# Patient Record
Sex: Female | Born: 1945 | Race: White | Hispanic: No | Marital: Married | Smoking: Never smoker
Health system: Southern US, Community
[De-identification: ages and names within clinical notes are randomized; demographics above are authoritative.]

## PROBLEM LIST (undated history)

## (undated) ENCOUNTER — Emergency Department (HOSPITAL_COMMUNITY): Discharge status: Against Medical Advice | Payer: Self-pay

## (undated) DIAGNOSIS — E7800 Pure hypercholesterolemia, unspecified: Secondary | ICD-10-CM

## (undated) DIAGNOSIS — R519 Headache, unspecified: Secondary | ICD-10-CM

## (undated) DIAGNOSIS — I10 Essential (primary) hypertension: Secondary | ICD-10-CM

## (undated) DIAGNOSIS — R51 Headache: Secondary | ICD-10-CM

## (undated) HISTORY — PX: WISDOM TOOTH EXTRACTION: SHX21

---

## 2012-02-23 ENCOUNTER — Emergency Department (INDEPENDENT_AMBULATORY_CARE_PROVIDER_SITE_OTHER)
Admission: EM | Admit: 2012-02-23 | Discharge: 2012-02-23 | Disposition: A | Discharge status: Home / Self Care | Payer: Self-pay | Source: Home / Self Care

## 2012-02-23 ENCOUNTER — Encounter (HOSPITAL_COMMUNITY): Payer: Self-pay | Admitting: *Deleted

## 2012-02-23 DIAGNOSIS — M542 Cervicalgia: Secondary | ICD-10-CM

## 2012-02-23 HISTORY — DX: Essential (primary) hypertension: I10

## 2012-02-23 HISTORY — DX: Pure hypercholesterolemia, unspecified: E78.00

## 2012-02-23 MED ORDER — CYCLOBENZAPRINE HCL 10 MG PO TABS: 10.0000 mg | ORAL_TABLET | Freq: Three times a day (TID) | ORAL | Status: AC | PRN

## 2012-02-23 MED ORDER — MELOXICAM 15 MG PO TABS: 15.0000 mg | ORAL_TABLET | Freq: Every day | ORAL | Status: DC

## 2012-02-23 MED ORDER — TRAMADOL HCL 50 MG PO TABS: 50.0000 mg | ORAL_TABLET | Freq: Four times a day (QID) | ORAL | Status: AC | PRN

## 2012-02-23 NOTE — ED Notes (Signed)
Pt c/o neck and shoulder pain onset about a week ago.  No known injury.  States that it's a throbbing, constant pain.

## 2012-02-23 NOTE — ED Provider Notes (Signed)
Medical screening examination/treatment/procedure(s) were performed by PGY-3 [facility] resident and as supervising physician I was immediately available for consultation/collaboration.   Aury Scollard Moreno-Coll, MD   Tatem Fesler Moreno-Coll, MD 02/23/12 1941 

## 2012-02-23 NOTE — Discharge Instructions (Signed)
Thank you for coming in today. Your pain is coming from a injured muscle in your neck.  Use the meloxicam daily and Flexeril as needed.  Tramadol should be used for breakthrough pain.  Continue your normal activities. Go to the emergency room if you have weakness in your hands problems walking or problems pooping or urinating.   Cervical Strain and Sprain (Whiplash) with Rehab Cervical strain and sprains are injuries that commonly occur with "whiplash" injuries. Whiplash occurs when the neck is forcefully whipped backward or forward, such as during a motor vehicle accident. The muscles, ligaments, tendons, discs and nerves of the neck are susceptible to injury when this occurs. SYMPTOMS   Pain or stiffness in the front and/or back of neck   Symptoms may present immediately or up to 24 hours after injury.   Dizziness, headache, nausea and vomiting.   Muscle spasm with soreness and stiffness in the neck.   Tenderness and swelling at the injury site.  CAUSES  Whiplash injuries often occur during contact sports or motor vehicle accidents.  RISK INCREASES WITH:  Osteoarthritis of the spine.   Situations that make head or neck accidents or trauma more likely.   High-risk sports (football, rugby, wrestling, hockey, auto racing, gymnastics, diving, contact karate or boxing).   Poor strength and flexibility of the neck.   Previous neck injury.   Poor tackling technique.   Improperly fitted or padded equipment.  PREVENTION  Learn and use proper technique (avoid tackling with the head, spearing and head-butting; use proper falling techniques to avoid landing on the head).   Warm up and stretch properly before activity.   Maintain physical fitness:   Strength, flexibility and endurance.   Cardiovascular fitness.   Wear properly fitted and padded protective equipment, such as padded soft collars, for participation in contact sports.  PROGNOSIS  Recovery for cervical strain and  sprain injuries is dependent on the extent of the injury. These injuries are usually curable in 1 week to 3 months with appropriate treatment.  RELATED COMPLICATIONS   Temporary numbness and weakness may occur if the nerve roots are damaged, and this may persist until the nerve has completely healed.   Chronic pain due to frequent recurrence of symptoms.   Prolonged healing, especially if activity is resumed too soon (before complete recovery).  TREATMENT  Treatment initially involves the use of ice and medication to help reduce pain and inflammation. It is also important to perform strengthening and stretching exercises and modify activities that worsen symptoms so the injury does not get worse. These exercises may be performed at home or with a therapist. For patients who experience severe symptoms, a soft padded collar may be recommended to be worn around the neck.  Improving your posture may help reduce symptoms. Posture improvement includes pulling your chin and abdomen in while sitting or standing. If you are sitting, sit in a firm chair with your buttocks against the back of the chair. While sleeping, try replacing your pillow with a small towel rolled to 2 inches in diameter, or use a cervical pillow or soft cervical collar. Poor sleeping positions delay healing.  For patients with nerve root damage, which causes numbness or weakness, the use of a cervical traction apparatus may be recommended. Surgery is rarely necessary for these injuries. However, cervical strain and sprains that are present at birth (congenital) may require surgery. MEDICATION   If pain medication is necessary, nonsteroidal anti-inflammatory medications, such as aspirin and ibuprofen, or other minor  pain relievers, such as acetaminophen, are often recommended.   Do not take pain medication for 7 days before surgery.   Prescription pain relievers may be given if deemed necessary by your caregiver. Use only as directed  and only as much as you need.  HEAT AND COLD:   Cold treatment (icing) relieves pain and reduces inflammation. Cold treatment should be applied for 10 to 15 minutes every 2 to 3 hours for inflammation and pain and immediately after any activity that aggravates your symptoms. Use ice packs or an ice massage.   Heat treatment may be used prior to performing the stretching and strengthening activities prescribed by your caregiver, physical therapist, or athletic trainer. Use a heat pack or a warm soak.  SEEK MEDICAL CARE IF:   Symptoms get worse or do not improve in 2 weeks despite treatment.   New, unexplained symptoms develop (drugs used in treatment may produce side effects).  EXERCISES RANGE OF MOTION (ROM) AND STRETCHING EXERCISES - Cervical Strain and Sprain These exercises may help you when beginning to rehabilitate your injury. In order to successfully resolve your symptoms, you must improve your posture. These exercises are designed to help reduce the forward-head and rounded-shoulder posture which contributes to this condition. Your symptoms may resolve with or without further involvement from your physician, physical therapist or athletic trainer. While completing these exercises, remember:   Restoring tissue flexibility helps normal motion to return to the joints. This allows healthier, less painful movement and activity.   An effective stretch should be held for at least 20 seconds, although you may need to begin with shorter hold times for comfort.   A stretch should never be painful. You should only feel a gentle lengthening or release in the stretched tissue.  STRETCH- Axial Extensors  Lie on your back on the floor. You may bend your knees for comfort. Place a rolled up hand towel or dish towel, about 2 inches in diameter, under the part of your head that makes contact with the floor.   Gently tuck your chin, as if trying to make a "double chin," until you feel a gentle stretch  at the base of your head.   Hold __________ seconds.  Repeat __________ times. Complete this exercise __________ times per day.  STRETECH - Axial Extension   Stand or sit on a firm surface. Assume a good posture: chest up, shoulders drawn back, abdominal muscles slightly tense, knees unlocked (if standing) and feet hip width apart.   Slowly retract your chin so your head slides back and your chin slightly lowers.Continue to look straight ahead.   You should feel a gentle stretch in the back of your head. Be certain not to feel an aggressive stretch since this can cause headaches later.   Hold for __________ seconds.  Repeat __________ times. Complete this exercise __________ times per day. STRETCH - Cervical Side Bend   Stand or sit on a firm surface. Assume a good posture: chest up, shoulders drawn back, abdominal muscles slightly tense, knees unlocked (if standing) and feet hip width apart.   Without letting your nose or shoulders move, slowly tip your right / left ear to your shoulder until your feel a gentle stretch in the muscles on the opposite side of your neck.   Hold __________ seconds.  Repeat __________ times. Complete this exercise __________ times per day. STRETCH - Cervical Rotators   Stand or sit on a firm surface. Assume a good posture: chest up, shoulders drawn  back, abdominal muscles slightly tense, knees unlocked (if standing) and feet hip width apart.   Keeping your eyes level with the ground, slowly turn your head until you feel a gentle stretch along the back and opposite side of your neck.   Hold __________ seconds.  Repeat __________ times. Complete this exercise __________ times per day. RANGE OF MOTION - Neck Circles   Stand or sit on a firm surface. Assume a good posture: chest up, shoulders drawn back, abdominal muscles slightly tense, knees unlocked (if standing) and feet hip width apart.   Gently roll your head down and around from the back of one  shoulder to the back of the other. The motion should never be forced or painful.   Repeat the motion 10-20 times, or until you feel the neck muscles relax and loosen.  Repeat __________ times. Complete the exercise __________ times per day. STRENGTHENING EXERCISES - Cervical Strain and Sprain These exercises may help you when beginning to rehabilitate your injury. They may resolve your symptoms with or without further involvement from your physician, physical therapist or athletic trainer. While completing these exercises, remember:   Muscles can gain both the endurance and the strength needed for everyday activities through controlled exercises.   Complete these exercises as instructed by your physician, physical therapist or athletic trainer. Progress the resistance and repetitions only as guided.   You may experience muscle soreness or fatigue, but the pain or discomfort you are trying to eliminate should never worsen during these exercises. If this pain does worsen, stop and make certain you are following the directions exactly. If the pain is still present after adjustments, discontinue the exercise until you can discuss the trouble with your clinician.  STRENGTH - Cervical Flexors, Isometric  Face a wall, standing about 6 inches away. Place a small pillow, a ball about 6-8 inches in diameter, or a folded towel between your forehead and the wall.   Slightly tuck your chin and gently push your forehead into the soft object. Push only with mild to moderate intensity, building up tension gradually. Keep your jaw and forehead relaxed.   Hold 10 to 20 seconds. Keep your breathing relaxed.   Release the tension slowly. Relax your neck muscles completely before you start the next repetition.  Repeat __________ times. Complete this exercise __________ times per day. STRENGTH- Cervical Lateral Flexors, Isometric   Stand about 6 inches away from a wall. Place a small pillow, a ball about 6-8  inches in diameter, or a folded towel between the side of your head and the wall.   Slightly tuck your chin and gently tilt your head into the soft object. Push only with mild to moderate intensity, building up tension gradually. Keep your jaw and forehead relaxed.   Hold 10 to 20 seconds. Keep your breathing relaxed.   Release the tension slowly. Relax your neck muscles completely before you start the next repetition.  Repeat __________ times. Complete this exercise __________ times per day. STRENGTH - Cervical Extensors, Isometric   Stand about 6 inches away from a wall. Place a small pillow, a ball about 6-8 inches in diameter, or a folded towel between the back of your head and the wall.   Slightly tuck your chin and gently tilt your head back into the soft object. Push only with mild to moderate intensity, building up tension gradually. Keep your jaw and forehead relaxed.   Hold 10 to 20 seconds. Keep your breathing relaxed.   Release the  tension slowly. Relax your neck muscles completely before you start the next repetition.  Repeat __________ times. Complete this exercise __________ times per day. POSTURE AND BODY MECHANICS CONSIDERATIONS - Cervical Strain and Sprain Keeping correct posture when sitting, standing or completing your activities will reduce the stress put on different body tissues, allowing injured tissues a chance to heal and limiting painful experiences. The following are general guidelines for improved posture. Your physician or physical therapist will provide you with any instructions specific to your needs. While reading these guidelines, remember:  The exercises prescribed by your provider will help you have the flexibility and strength to maintain correct postures.   The correct posture provides the optimal environment for your joints to work. All of your joints have less wear and tear when properly supported by a spine with good posture. This means you will  experience a healthier, less painful body.   Correct posture must be practiced with all of your activities, especially prolonged sitting and standing. Correct posture is as important when doing repetitive low-stress activities (typing) as it is when doing a single heavy-load activity (lifting).  PROLONGED STANDING WHILE SLIGHTLY LEANING FORWARD When completing a task that requires you to lean forward while standing in one place for a long time, place either foot up on a stationary 2-4 inch high object to help maintain the best posture. When both feet are on the ground, the low back tends to lose its slight inward curve. If this curve flattens (or becomes too large), then the back and your other joints will experience too much stress, fatigue more quickly and can cause pain.  RESTING POSITIONS Consider which positions are most painful for you when choosing a resting position. If you have pain with flexion-based activities (sitting, bending, stooping, squatting), choose a position that allows you to rest in a less flexed posture. You would want to avoid curling into a fetal position on your side. If your pain worsens with extension-based activities (prolonged standing, working overhead), avoid resting in an extended position such as sleeping on your stomach. Most people will find more comfort when they rest with their spine in a more neutral position, neither too rounded nor too arched. Lying on a non-sagging bed on your side with a pillow between your knees, or on your back with a pillow under your knees will often provide some relief. Keep in mind, being in any one position for a prolonged period of time, no matter how correct your posture, can still lead to stiffness. WALKING Walk with an upright posture. Your ears, shoulders and hips should all line-up. OFFICE WORK When working at a desk, create an environment that supports good, upright posture. Without extra support, muscles fatigue and lead to  excessive strain on joints and other tissues. CHAIR:  A chair should be able to slide under your desk when your back makes contact with the back of the chair. This allows you to work closely.   The chair's height should allow your eyes to be level with the upper part of your monitor and your hands to be slightly lower than your elbows.   Body position:   Your feet should make contact with the floor. If this is not possible, use a foot rest.   Keep your ears over your shoulders. This will reduce stress on your neck and low back.  Document Released: 11/11/2005 Document Revised: 10/31/2011 Document Reviewed: 02/23/2009 [facility] Patient Information 2012 [place], [place].

## 2012-02-23 NOTE — ED Provider Notes (Signed)
Ariel Walker is a 66 y.o. female who presents to Urgent Care today for left-sided neck pain radiating to the shoulder for one week. Has been taking Tylenol, Advil, and Aleve which helps not very much. Additionally she history heating pads. She denies any radicular pain to her hand numbness tingling or weakness. Additionally she denies any difficulty walking or bowel or bladder dysfunction. Her only significant past medical history his tennis elbow. She does not have her primary care doctor nor insurance. No history of injury before the pain started.   PMH reviewed. Otherwise healthy ROS as above otherwise neg.  no chest pains, palpitations, fevers, chills, abdominal pain nausea or vomiting. Medications reviewed. No current facility-administered medications for this encounter.   Current Outpatient Prescriptions  Medication Sig Dispense Refill  . cyclobenzaprine (FLEXERIL) 10 MG tablet Take 1 tablet (10 mg total) by mouth 3 (three) times daily as needed for muscle spasms.  30 tablet  0  . meloxicam (MOBIC) 15 MG tablet Take 1 tablet (15 mg total) by mouth daily.  14 tablet  0  . traMADol (ULTRAM) 50 MG tablet Take 1 tablet (50 mg total) by mouth every 6 (six) hours as needed for pain.  30 tablet  0    Exam:  BP 131/90  Pulse 93  Temp(Src) 97.9 F (36.6 C) (Oral)  Resp 18  SpO2 97% Gen: Well NAD Lungs: CTABL Nl WOB Heart: RRR no readings of the neck soft systolic murmur at the right upper sternal border Abd: NABS, NT, ND Exts: Non edematous BL  LE, warm and well perfused.  MSK: Neck: Inspection unremarkable. No palpable stepoffs. Or midline pain. Mildly tender on the left paraspinal muscles. Negative Spurling's maneuver. Full neck range of motion Grip strength and sensation normal in bilateral hands Strength good C4 to T1 distribution No sensory change to C4 to T1 Reflexes normal  Shoulder range of motion is normal negative impingement tests. Gait is normal. Patient is able to get  onto and off exam table by herself.    Assessment and Plan: 66 y.o. female with muscular neck pain.  No midline tenderness and exam is relatively normal.  She has no history of injury. Plan for symptom management with meloxicam, Flexeril, and tramadol as needed for breakthrough pain. Provided rehabilitation instructions and signs or symptoms that would promote return to health care. Patient expresses understanding. Followup as needed.  Her systolic murmur is likely aortic stenosis.  This is asymptomatic at this time. I recommend that she followup with her primary care doctor at some point this year. She will turn 22 and become Medicare eligible in August.      Rodolph Bong, MD 02/23/12 1357

## 2012-03-06 ENCOUNTER — Telehealth (HOSPITAL_COMMUNITY): Payer: Self-pay | Admitting: *Deleted

## 2012-03-06 NOTE — ED Notes (Signed)
Pt. called on VM @ 1358 and said she is having a problem with rectal bleeding.  1525  I called pt. back and she said she was seen here on Easter for a pulled muscle and got Mobic, Tramadol and Cyclobenzaprine. She took medicine for 1 week and got constipated. She has had 2 hard BM's and her hemorrhoids acted up. She is using Preparation H and wipes but states bleeding has not stopped. Passing bright red blood on tissue and in toilet approx. 1 tsp. I asked if is it still bleeding and she said yes.  I told her to apply pressure with an unsterile 4x4 for 10 min. continuously.  If the bleeding does not stop to go to the ED because she may need to have it cauterized. Pt. does not have a PCP. Pt. voiced understanding. Vassie Moselle 03/06/2012

## 2012-11-11 ENCOUNTER — Encounter (HOSPITAL_COMMUNITY): Payer: Self-pay | Admitting: Emergency Medicine

## 2012-11-11 ENCOUNTER — Emergency Department (HOSPITAL_COMMUNITY)
Admission: EM | Admit: 2012-11-11 | Discharge: 2012-11-11 | Disposition: A | Discharge status: Home / Self Care | Payer: No Typology Code available for payment source | Attending: Emergency Medicine | Admitting: Emergency Medicine

## 2012-11-11 ENCOUNTER — Emergency Department (HOSPITAL_COMMUNITY): Payer: No Typology Code available for payment source

## 2012-11-11 DIAGNOSIS — M25562 Pain in left knee: Secondary | ICD-10-CM

## 2012-11-11 DIAGNOSIS — S300XXA Contusion of lower back and pelvis, initial encounter: Secondary | ICD-10-CM

## 2012-11-11 DIAGNOSIS — Y939 Activity, unspecified: Secondary | ICD-10-CM | POA: Insufficient documentation

## 2012-11-11 DIAGNOSIS — E7800 Pure hypercholesterolemia, unspecified: Secondary | ICD-10-CM | POA: Insufficient documentation

## 2012-11-11 DIAGNOSIS — I10 Essential (primary) hypertension: Secondary | ICD-10-CM | POA: Insufficient documentation

## 2012-11-11 DIAGNOSIS — M25569 Pain in unspecified knee: Secondary | ICD-10-CM | POA: Insufficient documentation

## 2012-11-11 DIAGNOSIS — Y92410 Unspecified street and highway as the place of occurrence of the external cause: Secondary | ICD-10-CM | POA: Insufficient documentation

## 2012-11-11 DIAGNOSIS — S20229A Contusion of unspecified back wall of thorax, initial encounter: Secondary | ICD-10-CM | POA: Insufficient documentation
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
## 2012-11-11 NOTE — ED Notes (Signed)
Pt denied any pain while being cleared from spinal board. Amber, RN assisted in procedure.
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
## 2013-08-23 ENCOUNTER — Other Ambulatory Visit (HOSPITAL_COMMUNITY): Payer: Self-pay | Admitting: Family Medicine

## 2013-08-23 DIAGNOSIS — R922 Inconclusive mammogram: Secondary | ICD-10-CM

## 2013-08-25 ENCOUNTER — Encounter (HOSPITAL_COMMUNITY): Payer: Medicare Other

## 2013-09-01 ENCOUNTER — Ambulatory Visit (HOSPITAL_COMMUNITY)
Admission: RE | Admit: 2013-09-01 | Discharge: 2013-09-01 | Disposition: A | Discharge status: Home / Self Care | Payer: Medicare Other | Source: Ambulatory Visit | Attending: Family Medicine | Admitting: Family Medicine

## 2013-09-01 DIAGNOSIS — R928 Other abnormal and inconclusive findings on diagnostic imaging of breast: Secondary | ICD-10-CM | POA: Insufficient documentation

## 2013-09-01 DIAGNOSIS — R922 Inconclusive mammogram: Secondary | ICD-10-CM

## 2014-02-02 ENCOUNTER — Other Ambulatory Visit (HOSPITAL_COMMUNITY): Payer: Self-pay | Admitting: Family Medicine

## 2014-02-02 DIAGNOSIS — R928 Other abnormal and inconclusive findings on diagnostic imaging of breast: Secondary | ICD-10-CM

## 2014-03-09 ENCOUNTER — Ambulatory Visit (HOSPITAL_COMMUNITY)
Admission: RE | Admit: 2014-03-09 | Discharge: 2014-03-09 | Disposition: A | Discharge status: Home / Self Care | Payer: Medicare Other | Source: Ambulatory Visit | Attending: Family Medicine | Admitting: Family Medicine

## 2014-03-09 DIAGNOSIS — R928 Other abnormal and inconclusive findings on diagnostic imaging of breast: Secondary | ICD-10-CM

## 2014-03-09 DIAGNOSIS — N63 Unspecified lump in unspecified breast: Secondary | ICD-10-CM | POA: Insufficient documentation

## 2014-08-03 ENCOUNTER — Other Ambulatory Visit (HOSPITAL_COMMUNITY): Payer: Self-pay | Admitting: Family Medicine

## 2014-08-03 DIAGNOSIS — N6320 Unspecified lump in the left breast, unspecified quadrant: Principal | ICD-10-CM

## 2014-08-03 DIAGNOSIS — N6310 Unspecified lump in the right breast, unspecified quadrant: Secondary | ICD-10-CM

## 2014-08-03 DIAGNOSIS — R921 Mammographic calcification found on diagnostic imaging of breast: Secondary | ICD-10-CM

## 2014-08-03 DIAGNOSIS — Z09 Encounter for follow-up examination after completed treatment for conditions other than malignant neoplasm: Secondary | ICD-10-CM

## 2014-09-13 ENCOUNTER — Ambulatory Visit (HOSPITAL_COMMUNITY)
Admission: RE | Admit: 2014-09-13 | Discharge: 2014-09-13 | Disposition: A | Discharge status: Home / Self Care | Payer: Medicare Other | Source: Ambulatory Visit | Attending: Family Medicine | Admitting: Family Medicine

## 2014-09-13 DIAGNOSIS — R928 Other abnormal and inconclusive findings on diagnostic imaging of breast: Secondary | ICD-10-CM | POA: Diagnosis not present

## 2014-09-13 DIAGNOSIS — N6310 Unspecified lump in the right breast, unspecified quadrant: Secondary | ICD-10-CM

## 2014-09-13 DIAGNOSIS — N6320 Unspecified lump in the left breast, unspecified quadrant: Secondary | ICD-10-CM

## 2014-09-13 DIAGNOSIS — N63 Unspecified lump in breast: Secondary | ICD-10-CM | POA: Insufficient documentation

## 2014-09-13 DIAGNOSIS — R921 Mammographic calcification found on diagnostic imaging of breast: Secondary | ICD-10-CM

## 2014-09-13 DIAGNOSIS — Z09 Encounter for follow-up examination after completed treatment for conditions other than malignant neoplasm: Secondary | ICD-10-CM

## 2015-09-01 ENCOUNTER — Other Ambulatory Visit (HOSPITAL_COMMUNITY): Payer: Self-pay | Admitting: Family Medicine

## 2015-09-01 DIAGNOSIS — R229 Localized swelling, mass and lump, unspecified: Principal | ICD-10-CM

## 2015-09-01 DIAGNOSIS — IMO0002 Reserved for concepts with insufficient information to code with codable children: Secondary | ICD-10-CM

## 2015-09-19 ENCOUNTER — Ambulatory Visit (HOSPITAL_COMMUNITY)
Admission: RE | Admit: 2015-09-19 | Discharge: 2015-09-19 | Disposition: A | Discharge status: Home / Self Care | Payer: PPO | Source: Ambulatory Visit | Attending: Family Medicine | Admitting: Family Medicine

## 2015-09-19 DIAGNOSIS — R229 Localized swelling, mass and lump, unspecified: Secondary | ICD-10-CM

## 2015-09-19 DIAGNOSIS — R921 Mammographic calcification found on diagnostic imaging of breast: Secondary | ICD-10-CM | POA: Insufficient documentation

## 2015-09-19 DIAGNOSIS — IMO0002 Reserved for concepts with insufficient information to code with codable children: Secondary | ICD-10-CM

## 2015-12-07 DIAGNOSIS — H52223 Regular astigmatism, bilateral: Secondary | ICD-10-CM | POA: Diagnosis not present

## 2015-12-07 DIAGNOSIS — H25013 Cortical age-related cataract, bilateral: Secondary | ICD-10-CM | POA: Diagnosis not present

## 2015-12-07 DIAGNOSIS — H5213 Myopia, bilateral: Secondary | ICD-10-CM | POA: Diagnosis not present

## 2015-12-07 DIAGNOSIS — H524 Presbyopia: Secondary | ICD-10-CM | POA: Diagnosis not present

## 2015-12-07 DIAGNOSIS — H1851 Endothelial corneal dystrophy: Secondary | ICD-10-CM | POA: Diagnosis not present

## 2015-12-07 DIAGNOSIS — H3540 Unspecified peripheral retinal degeneration: Secondary | ICD-10-CM | POA: Diagnosis not present

## 2016-02-07 DIAGNOSIS — I10 Essential (primary) hypertension: Secondary | ICD-10-CM | POA: Diagnosis not present

## 2016-02-07 DIAGNOSIS — E782 Mixed hyperlipidemia: Secondary | ICD-10-CM | POA: Diagnosis not present

## 2016-02-07 DIAGNOSIS — E559 Vitamin D deficiency, unspecified: Secondary | ICD-10-CM | POA: Diagnosis not present

## 2016-02-07 DIAGNOSIS — R030 Elevated blood-pressure reading, without diagnosis of hypertension: Secondary | ICD-10-CM | POA: Diagnosis not present

## 2016-02-07 DIAGNOSIS — H1851 Endothelial corneal dystrophy: Secondary | ICD-10-CM | POA: Diagnosis not present

## 2016-02-07 DIAGNOSIS — H2513 Age-related nuclear cataract, bilateral: Secondary | ICD-10-CM | POA: Diagnosis not present

## 2016-02-13 DIAGNOSIS — E782 Mixed hyperlipidemia: Secondary | ICD-10-CM | POA: Diagnosis not present

## 2016-02-13 DIAGNOSIS — E559 Vitamin D deficiency, unspecified: Secondary | ICD-10-CM | POA: Diagnosis not present

## 2016-02-13 DIAGNOSIS — H1851 Endothelial corneal dystrophy: Secondary | ICD-10-CM | POA: Diagnosis not present

## 2016-02-13 DIAGNOSIS — Z1389 Encounter for screening for other disorder: Secondary | ICD-10-CM | POA: Diagnosis not present

## 2016-02-13 DIAGNOSIS — Z0001 Encounter for general adult medical examination with abnormal findings: Secondary | ICD-10-CM | POA: Diagnosis not present

## 2016-02-13 DIAGNOSIS — I10 Essential (primary) hypertension: Secondary | ICD-10-CM | POA: Diagnosis not present

## 2016-03-05 DIAGNOSIS — M5431 Sciatica, right side: Secondary | ICD-10-CM | POA: Diagnosis not present

## 2016-03-13 DIAGNOSIS — H1851 Endothelial corneal dystrophy: Secondary | ICD-10-CM | POA: Diagnosis not present

## 2016-03-13 DIAGNOSIS — H2513 Age-related nuclear cataract, bilateral: Secondary | ICD-10-CM | POA: Diagnosis not present

## 2016-05-06 DIAGNOSIS — H52203 Unspecified astigmatism, bilateral: Secondary | ICD-10-CM | POA: Diagnosis not present

## 2016-05-06 DIAGNOSIS — H5213 Myopia, bilateral: Secondary | ICD-10-CM | POA: Diagnosis not present

## 2016-05-16 DIAGNOSIS — M1712 Unilateral primary osteoarthritis, left knee: Secondary | ICD-10-CM | POA: Diagnosis not present

## 2016-05-16 DIAGNOSIS — M1711 Unilateral primary osteoarthritis, right knee: Secondary | ICD-10-CM | POA: Diagnosis not present

## 2016-05-16 DIAGNOSIS — M7122 Synovial cyst of popliteal space [Baker], left knee: Secondary | ICD-10-CM | POA: Diagnosis not present

## 2016-05-16 DIAGNOSIS — M7121 Synovial cyst of popliteal space [Baker], right knee: Secondary | ICD-10-CM | POA: Diagnosis not present

## 2016-05-20 DIAGNOSIS — M6281 Muscle weakness (generalized): Secondary | ICD-10-CM | POA: Diagnosis not present

## 2016-05-20 DIAGNOSIS — M170 Bilateral primary osteoarthritis of knee: Secondary | ICD-10-CM | POA: Diagnosis not present

## 2016-05-20 DIAGNOSIS — M7121 Synovial cyst of popliteal space [Baker], right knee: Secondary | ICD-10-CM | POA: Diagnosis not present

## 2016-05-20 DIAGNOSIS — M7122 Synovial cyst of popliteal space [Baker], left knee: Secondary | ICD-10-CM | POA: Diagnosis not present

## 2016-05-29 DIAGNOSIS — M6281 Muscle weakness (generalized): Secondary | ICD-10-CM | POA: Diagnosis not present

## 2016-05-29 DIAGNOSIS — M7120 Synovial cyst of popliteal space [Baker], unspecified knee: Secondary | ICD-10-CM | POA: Diagnosis not present

## 2016-05-29 DIAGNOSIS — M170 Bilateral primary osteoarthritis of knee: Secondary | ICD-10-CM | POA: Diagnosis not present

## 2016-08-12 DIAGNOSIS — E782 Mixed hyperlipidemia: Secondary | ICD-10-CM | POA: Diagnosis not present

## 2016-08-12 DIAGNOSIS — I10 Essential (primary) hypertension: Secondary | ICD-10-CM | POA: Diagnosis not present

## 2016-08-15 DIAGNOSIS — M1711 Unilateral primary osteoarthritis, right knee: Secondary | ICD-10-CM | POA: Diagnosis not present

## 2016-08-15 DIAGNOSIS — B079 Viral wart, unspecified: Secondary | ICD-10-CM | POA: Diagnosis not present

## 2016-08-15 DIAGNOSIS — E559 Vitamin D deficiency, unspecified: Secondary | ICD-10-CM | POA: Diagnosis not present

## 2016-08-15 DIAGNOSIS — H1851 Endothelial corneal dystrophy: Secondary | ICD-10-CM | POA: Diagnosis not present

## 2016-08-15 DIAGNOSIS — I10 Essential (primary) hypertension: Secondary | ICD-10-CM | POA: Diagnosis not present

## 2016-08-15 DIAGNOSIS — M1712 Unilateral primary osteoarthritis, left knee: Secondary | ICD-10-CM | POA: Diagnosis not present

## 2016-08-15 DIAGNOSIS — Z6834 Body mass index (BMI) 34.0-34.9, adult: Secondary | ICD-10-CM | POA: Diagnosis not present

## 2016-08-15 DIAGNOSIS — E782 Mixed hyperlipidemia: Secondary | ICD-10-CM | POA: Diagnosis not present

## 2016-08-27 ENCOUNTER — Other Ambulatory Visit (HOSPITAL_COMMUNITY): Payer: Self-pay | Admitting: Family Medicine

## 2016-08-27 DIAGNOSIS — Z1231 Encounter for screening mammogram for malignant neoplasm of breast: Secondary | ICD-10-CM

## 2016-09-19 ENCOUNTER — Ambulatory Visit (HOSPITAL_COMMUNITY)
Admission: RE | Admit: 2016-09-19 | Discharge: 2016-09-19 | Disposition: A | Discharge status: Home / Self Care | Payer: PPO | Source: Ambulatory Visit | Attending: Family Medicine | Admitting: Family Medicine

## 2016-09-19 DIAGNOSIS — Z1231 Encounter for screening mammogram for malignant neoplasm of breast: Secondary | ICD-10-CM | POA: Diagnosis not present

## 2017-02-18 DIAGNOSIS — E782 Mixed hyperlipidemia: Secondary | ICD-10-CM | POA: Diagnosis not present

## 2017-02-18 DIAGNOSIS — I10 Essential (primary) hypertension: Secondary | ICD-10-CM | POA: Diagnosis not present

## 2017-02-18 DIAGNOSIS — E559 Vitamin D deficiency, unspecified: Secondary | ICD-10-CM | POA: Diagnosis not present

## 2017-02-19 DIAGNOSIS — E559 Vitamin D deficiency, unspecified: Secondary | ICD-10-CM | POA: Diagnosis not present

## 2017-02-19 DIAGNOSIS — E782 Mixed hyperlipidemia: Secondary | ICD-10-CM | POA: Diagnosis not present

## 2017-02-19 DIAGNOSIS — M1712 Unilateral primary osteoarthritis, left knee: Secondary | ICD-10-CM | POA: Diagnosis not present

## 2017-02-19 DIAGNOSIS — Z6834 Body mass index (BMI) 34.0-34.9, adult: Secondary | ICD-10-CM | POA: Diagnosis not present

## 2017-02-19 DIAGNOSIS — I10 Essential (primary) hypertension: Secondary | ICD-10-CM | POA: Diagnosis not present

## 2017-02-19 DIAGNOSIS — M1711 Unilateral primary osteoarthritis, right knee: Secondary | ICD-10-CM | POA: Diagnosis not present

## 2017-02-19 DIAGNOSIS — Z0001 Encounter for general adult medical examination with abnormal findings: Secondary | ICD-10-CM | POA: Diagnosis not present

## 2017-02-19 DIAGNOSIS — H1851 Endothelial corneal dystrophy: Secondary | ICD-10-CM | POA: Diagnosis not present

## 2017-02-25 ENCOUNTER — Other Ambulatory Visit (HOSPITAL_COMMUNITY): Payer: Self-pay | Admitting: Family Medicine

## 2017-02-25 DIAGNOSIS — M8580 Other specified disorders of bone density and structure, unspecified site: Secondary | ICD-10-CM

## 2017-02-25 DIAGNOSIS — Z780 Asymptomatic menopausal state: Secondary | ICD-10-CM

## 2017-03-05 ENCOUNTER — Ambulatory Visit (HOSPITAL_COMMUNITY)
Admission: RE | Admit: 2017-03-05 | Discharge: 2017-03-05 | Disposition: A | Discharge status: Home / Self Care | Payer: PPO | Source: Ambulatory Visit | Attending: Family Medicine | Admitting: Family Medicine

## 2017-03-05 DIAGNOSIS — Z780 Asymptomatic menopausal state: Secondary | ICD-10-CM | POA: Diagnosis not present

## 2017-03-05 DIAGNOSIS — M810 Age-related osteoporosis without current pathological fracture: Secondary | ICD-10-CM | POA: Diagnosis not present

## 2017-03-05 DIAGNOSIS — M8580 Other specified disorders of bone density and structure, unspecified site: Secondary | ICD-10-CM

## 2017-07-12 ENCOUNTER — Encounter (HOSPITAL_COMMUNITY): Payer: Self-pay | Admitting: *Deleted

## 2017-07-12 ENCOUNTER — Emergency Department (HOSPITAL_COMMUNITY)
Admission: EM | Admit: 2017-07-12 | Discharge: 2017-07-12 | Disposition: A | Discharge status: Home / Self Care | Payer: PPO | Attending: Emergency Medicine | Admitting: Emergency Medicine

## 2017-07-12 ENCOUNTER — Emergency Department (HOSPITAL_COMMUNITY): Payer: PPO

## 2017-07-12 DIAGNOSIS — Y929 Unspecified place or not applicable: Secondary | ICD-10-CM | POA: Diagnosis not present

## 2017-07-12 DIAGNOSIS — Y939 Activity, unspecified: Secondary | ICD-10-CM | POA: Diagnosis not present

## 2017-07-12 DIAGNOSIS — S82832A Other fracture of upper and lower end of left fibula, initial encounter for closed fracture: Secondary | ICD-10-CM | POA: Diagnosis not present

## 2017-07-12 DIAGNOSIS — Y999 Unspecified external cause status: Secondary | ICD-10-CM | POA: Insufficient documentation

## 2017-07-12 DIAGNOSIS — S99912A Unspecified injury of left ankle, initial encounter: Secondary | ICD-10-CM | POA: Diagnosis present

## 2017-07-12 DIAGNOSIS — W010XXA Fall on same level from slipping, tripping and stumbling without subsequent striking against object, initial encounter: Secondary | ICD-10-CM | POA: Diagnosis not present

## 2017-07-12 DIAGNOSIS — Z23 Encounter for immunization: Secondary | ICD-10-CM | POA: Diagnosis not present

## 2017-07-12 DIAGNOSIS — I10 Essential (primary) hypertension: Secondary | ICD-10-CM | POA: Insufficient documentation

## 2017-07-12 DIAGNOSIS — S92902A Unspecified fracture of left foot, initial encounter for closed fracture: Secondary | ICD-10-CM | POA: Diagnosis not present

## 2017-07-12 MED ORDER — IBUPROFEN 800 MG PO TABS: 800.0000 mg | ORAL_TABLET | Freq: Three times a day (TID) | ORAL | 0 refills | Status: DC

## 2017-07-12 MED ORDER — IBUPROFEN 800 MG PO TABS
800.0000 mg | ORAL_TABLET | Freq: Once | ORAL | Status: AC
  Administered 2017-07-12: 800 mg via ORAL
  Filled 2017-07-12: qty 1

## 2017-07-12 MED ORDER — BACITRACIN ZINC 500 UNIT/GM EX OINT
TOPICAL_OINTMENT | CUTANEOUS | Status: AC
  Filled 2017-07-12: qty 0.9

## 2017-07-12 MED ORDER — TETANUS-DIPHTH-ACELL PERTUSSIS 5-2.5-18.5 LF-MCG/0.5 IM SUSP
0.5000 mL | Freq: Once | INTRAMUSCULAR | Status: AC
  Administered 2017-07-12: 0.5 mL via INTRAMUSCULAR
  Filled 2017-07-12: qty 0.5
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
## 2017-07-12 NOTE — ED Provider Notes (Signed)
[facility] DEPT Provider Note   CSN: [id number] Arrival date & time: 07/12/17  1654     History   Chief Complaint Chief Complaint  Patient presents with  . Ankle Pain    HPI Ariel Walker is a 71 y.o. female.  HPI  The pt is a 71 y/o female - who presents after falling and sustaining an ankle injury on the left. She reports that she was trying to throw some material into a rubbish pile when her ankle slipped and she fell twisting her left ankle, feeling acute onset of pain and having persistent pain with mild swelling in the left ankle that is precluding her from being able to walk well. She endorses having swelling with this but no numbness. Pain is worse with ambulation. No bleeding. She is not up-to-date on tetanus. She sustained associated abrasions to the right lateral lower extremity below the knee and the right forearm at the elbow.  Past Medical History:  Diagnosis Date  . High cholesterol   . Hypertension     There are no active problems to display for this patient.   History reviewed. No pertinent surgical history.  OB History    No data available       Home Medications    Prior to Admission medications   Medication Sig Start Date End Date Taking? Authorizing Provider  ibuprofen (ADVIL,MOTRIN) 800 MG tablet Take 1 tablet (800 mg total) by mouth 3 (three) times daily. 07/12/17   Noemi Chapel, MD    Family History No family history on file.  Social History Social History  Substance Use Topics  . Smoking status: Never Smoker  . Smokeless tobacco: Never Used  . Alcohol use No     Allergies   Aspirin   Review of Systems Review of Systems  All other systems reviewed and are negative.    Physical Exam Updated Vital Signs BP 129/61   Pulse 81   Temp 98.1 F (36.7 C) (Oral)   Resp 17   Ht 4' 9.5" (1.461 m)   Wt 79.8 kg (176 lb)   SpO2 100%   BMI 37.43 kg/m   Physical Exam  Constitutional: She appears well-developed and  well-nourished. No distress.  HENT:  Head: Normocephalic and atraumatic.  Mouth/Throat: Oropharynx is clear and moist. No oropharyngeal exudate.  Eyes: Pupils are equal, round, and reactive to light. Conjunctivae and EOM are normal. Right eye exhibits no discharge. Left eye exhibits no discharge. No scleral icterus.  Neck: Normal range of motion. Neck supple. No JVD present. No thyromegaly present.  Cardiovascular: Normal rate, regular rhythm, normal heart sounds and intact distal pulses.  Exam reveals no gallop and no friction rub.   No murmur heard. Pulmonary/Chest: Effort normal and breath sounds normal. No respiratory distress. She has no wheezes. She has no rales.  Abdominal: Soft. Bowel sounds are normal. She exhibits no distension and no mass. There is no tenderness.  Musculoskeletal: Normal range of motion. She exhibits tenderness and deformity. She exhibits no edema.  Tenderness over the lateral malleolus, no tenderness over the base of the fifth metatarsal. Decreased range of motion of the left ankle secondary to tenderness. Overall alignment as well, no tenderness over the proximal fibular head.  Lymphadenopathy:    She has no cervical adenopathy.  Neurological: She is alert. Coordination normal.  Normal sensation to touch of the foot - normal strength of the foot.  Skin: Skin is warm and dry. Rash noted. No erythema.  Psychiatric: She  has a normal mood and affect. Her behavior is normal.  Nursing note and vitals reviewed.    ED Treatments / Results  Labs (all labs ordered are listed, but only abnormal results are displayed) Labs Reviewed - No data to display  EKG  EKG Interpretation None       Radiology Dg Tibia/fibula Left  Result Date: 07/12/2017 CLINICAL DATA:  [place] into ditch with lateral pain EXAM: LEFT TIBIA AND FIBULA - 2 VIEW COMPARISON:  None. FINDINGS: Proximal fibula and tibia appear intact. Acute nondisplaced fracture involving the tip of the lateral  fibular malleolus. Soft tissue swelling distal leg. IMPRESSION: Acute nondisplaced fracture involving the lateral fibular malleolar tip Electronically Signed   By: Donavan Foil M.D.   On: 07/12/2017 18:02   Dg Ankle Complete Left  Result Date: 07/12/2017 CLINICAL DATA:  [place] into a ditch, lateral ankle pain EXAM: LEFT ANKLE COMPLETE - 3+ VIEW COMPARISON:  None. FINDINGS: Acute nondisplaced fracture involving the tip of the lateral malleolus of the fibula. Mild degenerative changes of the medial ankle joint. Ankle mortise is symmetric. Small plantar calcaneal spur. Large amount of lateral soft tissue swelling IMPRESSION: Acute nondisplaced fracture involving the lateral fibular malleolar tip Electronically Signed   By: Donavan Foil M.D.   On: 07/12/2017 18:01   Dg Foot Complete Left  Result Date: 07/12/2017 CLINICAL DATA:  [place] into a ditch, lateral foot and ankle pain EXAM: LEFT FOOT - COMPLETE 3+ VIEW COMPARISON:  None. FINDINGS: No acute fracture or malalignment at the left foot. Plantar calcaneal spur. Nondisplaced fibular malleolar fracture. IMPRESSION: 1. No acute osseous abnormality at the foot 2. Acute nondisplaced lateral fibular malleolar fracture Electronically Signed   By: Donavan Foil M.D.   On: 07/12/2017 18:03    Procedures Procedures (including critical care time)  Medications Ordered in ED Medications  ibuprofen (ADVIL,MOTRIN) tablet 800 mg (not administered)  Tdap (BOOSTRIX) injection 0.5 mL (not administered)     Initial Impression / Assessment and Plan / ED Course  I have reviewed the triage vital signs and the nursing notes.  Pertinent labs & imaging results that were available during my care of the patient were reviewed by me and considered in my medical decision making (see chart for details).    Has lateral fib fracture Stable joint Weber A at the most. RICE therapy  Ortho f/u outpt  Final Clinical Impressions(s) / ED Diagnoses   Final diagnoses:  Closed  fracture of distal end of left fibula, unspecified fracture morphology, initial encounter    New Prescriptions New Prescriptions   IBUPROFEN (ADVIL,MOTRIN) 800 MG TABLET    Take 1 tablet (800 mg total) by mouth 3 (three) times daily.     Noemi Chapel, MD 07/12/17 [phone]

## 2017-07-12 NOTE — ED Notes (Signed)
Pt wound on right shin flushed with wound cleaner, bacitracin applied, nonstick dressing applied with fluffy guaze roll.

## 2017-07-12 NOTE — ED Triage Notes (Addendum)
Pt c/o left foot, ankle, and leg pain after her ankle turned and she fell while she was working in the yard today. Denies LOC upon fall. Swelling noted to left ankle. Abrasion to right lower leg.

## 2017-07-12 NOTE — Discharge Instructions (Signed)
Your testing today shows that you have a small chip fracture to the bottom of your bone, this does not need surgery and should heal spontaneously over the next 4 weeks. You will need to follow-up with the orthopedic doctor in the next week to 10 days, please bear minimal weight on her ankle, use the walking boot as well as the crutches to help. Ibuprofen for pain, elevation and ice and rest.

## 2017-07-21 ENCOUNTER — Encounter: Payer: Self-pay | Admitting: Orthopedic Surgery

## 2017-07-21 ENCOUNTER — Ambulatory Visit (INDEPENDENT_AMBULATORY_CARE_PROVIDER_SITE_OTHER): Payer: PPO | Admitting: Orthopedic Surgery

## 2017-07-21 VITALS — BP 151/91 | HR 88 | Temp 97.4°F

## 2017-07-21 DIAGNOSIS — S82832A Other fracture of upper and lower end of left fibula, initial encounter for closed fracture: Secondary | ICD-10-CM

## 2017-07-21 DIAGNOSIS — S93492A Sprain of other ligament of left ankle, initial encounter: Secondary | ICD-10-CM

## 2017-07-21 NOTE — Patient Instructions (Signed)
You are allowed weightbearing as tolerated in the brace  He did not have to sleep in the brace  Start Epson salt soaks for 20 minutes 2-3 times per day  Return in 4 weeks for another x-ray

## 2017-07-21 NOTE — Progress Notes (Signed)
Patient ID: Ariel Walker, female   DOB: November 05, 1946, 71 y.o.   MRN: [id number]  Chief Complaint  Patient presents with  . Ankle Injury    Lt distal fibula fx, DOI 07/12/17    HPI Ariel Walker is a 71 y.o. female. Presents for evaluation of left lower extremity injury Doi: 07/12/2017 Patient was cleaning up some brush her foot came out of her sandal she injured her left ankle. She had x-rays of the ankle foot and tibia and fibula and noted left distal fibular fracture nondisplaced. Mild dull pain  Moderate swelling  Pain with weight bearing with crutches    Review of Systems Review of Systems  Respiratory: Negative.   Cardiovascular: Negative.   Neurological: Negative.      Past Medical History:  Diagnosis Date  . High cholesterol   . Hypertension     No past surgical history on file.  No family history on file.  Social History Social History  Substance Use Topics  . Smoking status: Never Smoker  . Smokeless tobacco: Never Used  . Alcohol use No    Allergies  Allergen Reactions  . Aspirin     Current Outpatient Prescriptions  Medication Sig Dispense Refill  . ibuprofen (ADVIL,MOTRIN) 800 MG tablet Take 1 tablet (800 mg total) by mouth 3 (three) times daily. 21 tablet 0   No current facility-administered medications for this visit.     Physical Exam Blood pressure (!) 151/91, pulse 88, temperature (!) 97.4 F (36.3 C). Physical Exam The patient is well developed well nourished and well groomed.  Orientation to person place and time is normal  Mood is pleasant.  Ambulatory status crutches wbat in cam walker  Ortho Exam  Left  Ankle examination: Inspection reveals tenderness and swelling over the lateral malleolus. The medial malleolus is non tender   Range of motion is limited by pain foot is plantigrade. Ankle stability tests were deferred because of pain in the ankle does not appear to be subluxated. Motor exam shows no atrophy  Skin shows mild  bruising and ecchymosis. Neurovascular exam is intact.  Opposite ankle shows no deformity.    Data Reviewed  I reviewed the x-ray and independently interpreted as 3 views of the ankle and there is a non displaced lateral malleolus fracture intact ankle mortise      Assessment    Encounter Diagnoses  Name Primary?  . Other closed fracture of distal end of left fibula, initial encounter Yes  . Sprain of anterior talofibular ligament of left ankle, initial encounter     Plan    She can start soaking the foot with Epson salts  Weight-bear as tolerated  X-ray in 4 weeks  (No fracture care)

## 2017-08-08 ENCOUNTER — Other Ambulatory Visit (HOSPITAL_COMMUNITY): Payer: Self-pay | Admitting: Family Medicine

## 2017-08-08 DIAGNOSIS — Z1231 Encounter for screening mammogram for malignant neoplasm of breast: Secondary | ICD-10-CM

## 2017-08-19 ENCOUNTER — Ambulatory Visit (INDEPENDENT_AMBULATORY_CARE_PROVIDER_SITE_OTHER): Payer: Self-pay | Admitting: Orthopedic Surgery

## 2017-08-19 ENCOUNTER — Encounter: Payer: Self-pay | Admitting: Orthopedic Surgery

## 2017-08-19 ENCOUNTER — Ambulatory Visit (INDEPENDENT_AMBULATORY_CARE_PROVIDER_SITE_OTHER): Payer: PPO

## 2017-08-19 VITALS — Ht 60.0 in | Wt 177.0 lb

## 2017-08-19 DIAGNOSIS — S82832D Other fracture of upper and lower end of left fibula, subsequent encounter for closed fracture with routine healing: Secondary | ICD-10-CM

## 2017-08-19 NOTE — Progress Notes (Signed)
Fracture care follow-up  Encounter Diagnosis  Name Primary?  . Other closed fracture of distal end of left fibula with routine healing, subsequent encounter Yes   Chief Complaint  Patient presents with  . Ankle Injury    left ankle fracture distal fibula date of injury 07/12/17 4 1/2 weeks s/p    This is post injury day #38/5 weeks 3 days Treatment Weightbearing as tolerated in brace  Pain is improved, patient can weight-bear as tolerated without the brace  Exam shows mild tenderness tip of fibula  Ankle range of motion is reasonable  Plan is to release her to normal activity

## 2017-08-21 DIAGNOSIS — E782 Mixed hyperlipidemia: Secondary | ICD-10-CM | POA: Diagnosis not present

## 2017-08-21 DIAGNOSIS — I10 Essential (primary) hypertension: Secondary | ICD-10-CM | POA: Diagnosis not present

## 2017-08-27 DIAGNOSIS — E782 Mixed hyperlipidemia: Secondary | ICD-10-CM | POA: Diagnosis not present

## 2017-08-27 DIAGNOSIS — M1711 Unilateral primary osteoarthritis, right knee: Secondary | ICD-10-CM | POA: Diagnosis not present

## 2017-08-27 DIAGNOSIS — E559 Vitamin D deficiency, unspecified: Secondary | ICD-10-CM | POA: Diagnosis not present

## 2017-08-27 DIAGNOSIS — Z6834 Body mass index (BMI) 34.0-34.9, adult: Secondary | ICD-10-CM | POA: Diagnosis not present

## 2017-08-27 DIAGNOSIS — M1712 Unilateral primary osteoarthritis, left knee: Secondary | ICD-10-CM | POA: Diagnosis not present

## 2017-08-27 DIAGNOSIS — M810 Age-related osteoporosis without current pathological fracture: Secondary | ICD-10-CM | POA: Diagnosis not present

## 2017-08-27 DIAGNOSIS — I10 Essential (primary) hypertension: Secondary | ICD-10-CM | POA: Diagnosis not present

## 2017-09-22 ENCOUNTER — Ambulatory Visit (HOSPITAL_COMMUNITY): Payer: PPO

## 2017-09-22 IMAGING — DX DG FOOT COMPLETE 3+V*L*
3 series · 3 of 3 positions shown · non-contrast
Comparison: None.

CLINICAL DATA: Fell into a ditch, lateral foot and ankle pain

EXAM:
LEFT FOOT - COMPLETE 3+ VIEW

[foot ap]
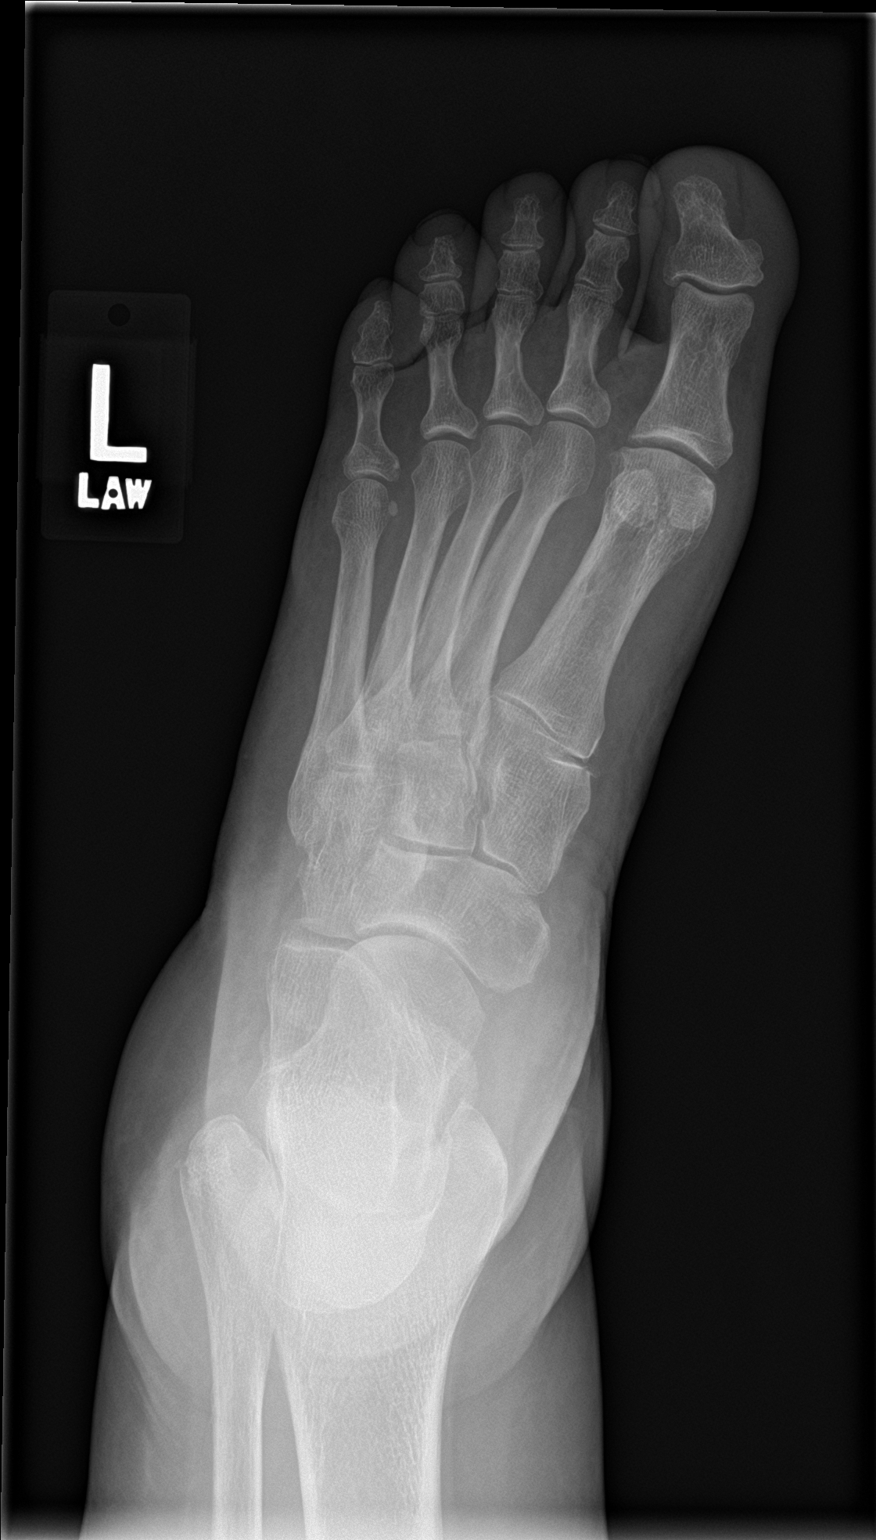

[foot obl]
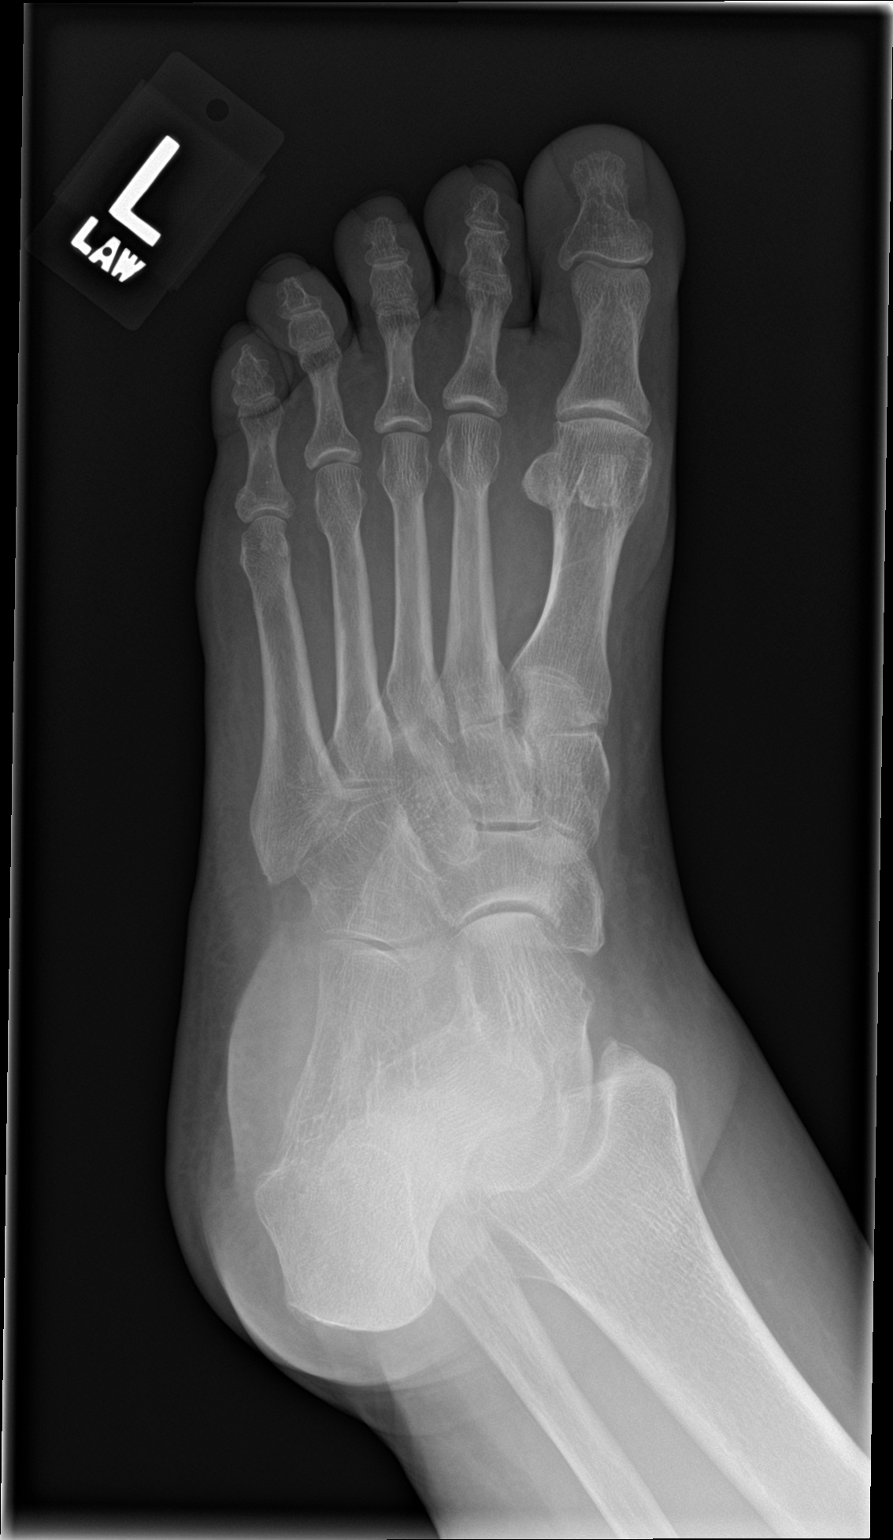

[foot lat]
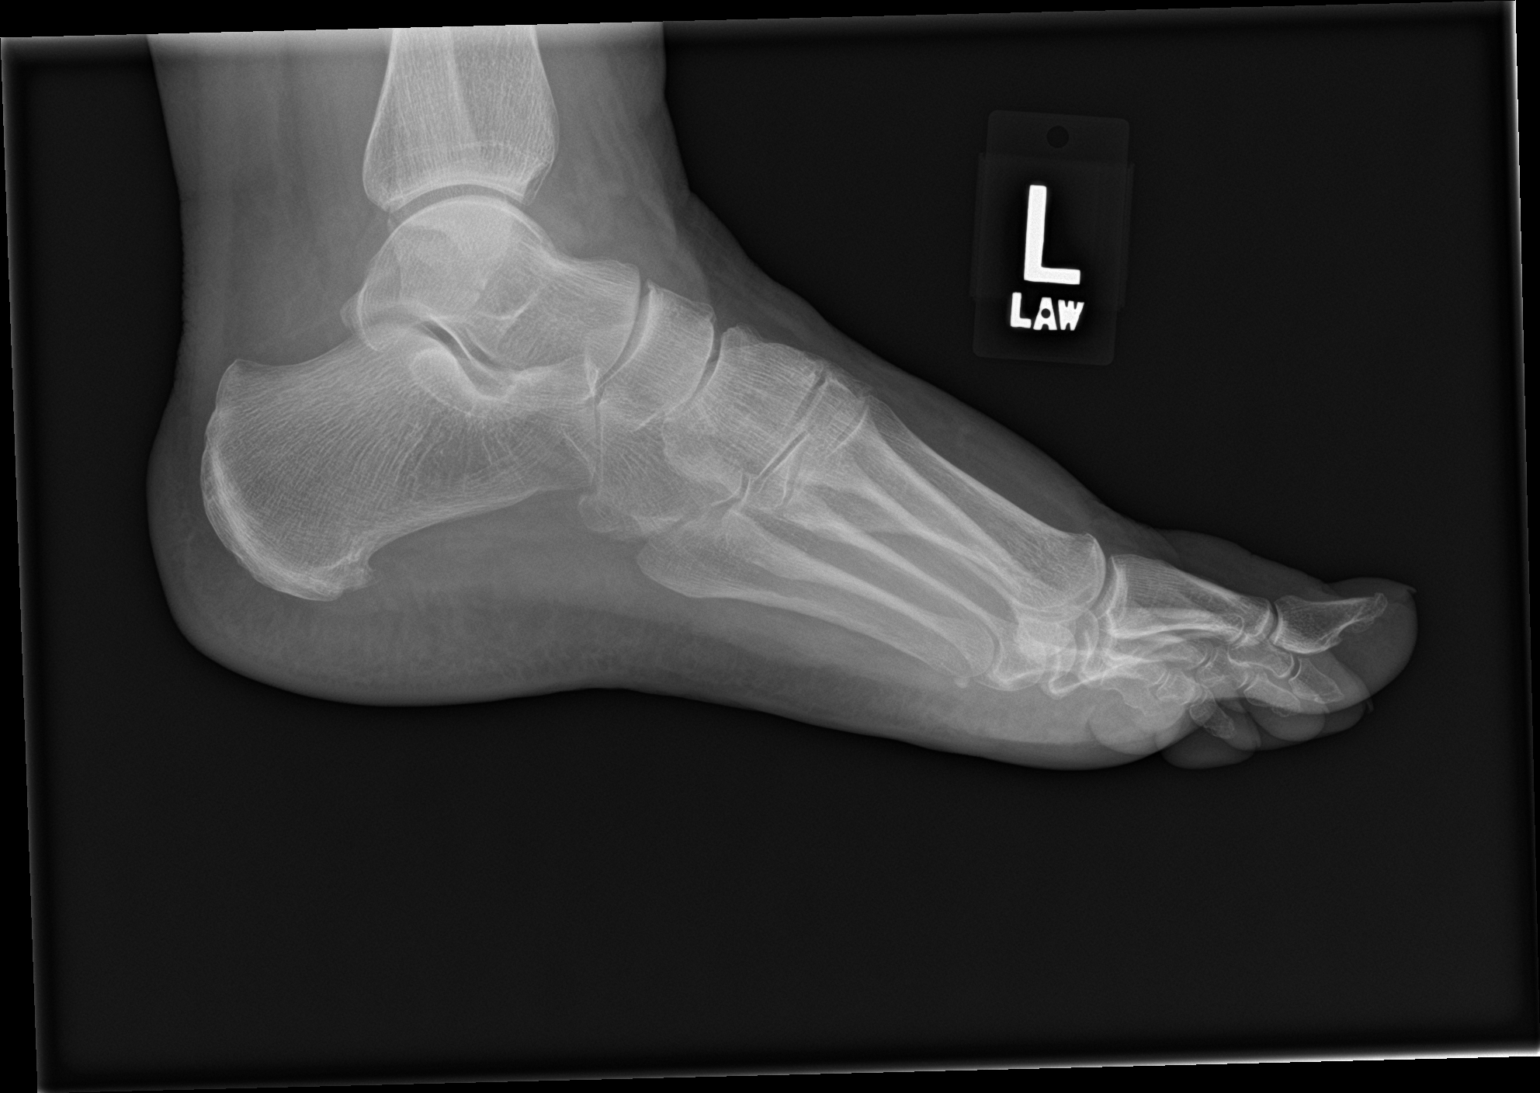

[3 of 3 positions shown; findings below may reference images not displayed]

FINDINGS: No acute fracture or malalignment at the left foot. Plantar
calcaneal spur. Nondisplaced fibular malleolar fracture.
IMPRESSION: 1. No acute osseous abnormality at the foot
2. Acute nondisplaced lateral fibular malleolar fracture

## 2017-09-24 ENCOUNTER — Ambulatory Visit (HOSPITAL_COMMUNITY): Payer: PPO

## 2017-12-31 ENCOUNTER — Other Ambulatory Visit (HOSPITAL_COMMUNITY): Payer: Self-pay | Admitting: Family Medicine

## 2017-12-31 ENCOUNTER — Ambulatory Visit (HOSPITAL_COMMUNITY)
Admission: RE | Admit: 2017-12-31 | Discharge: 2017-12-31 | Disposition: A | Discharge status: Home / Self Care | Payer: PPO | Source: Ambulatory Visit | Attending: Family Medicine | Admitting: Family Medicine

## 2017-12-31 DIAGNOSIS — Z1231 Encounter for screening mammogram for malignant neoplasm of breast: Secondary | ICD-10-CM

## 2018-01-15 DIAGNOSIS — K5901 Slow transit constipation: Secondary | ICD-10-CM | POA: Diagnosis not present

## 2018-01-15 DIAGNOSIS — Z6836 Body mass index (BMI) 36.0-36.9, adult: Secondary | ICD-10-CM | POA: Diagnosis not present

## 2018-02-27 DIAGNOSIS — E782 Mixed hyperlipidemia: Secondary | ICD-10-CM | POA: Diagnosis not present

## 2018-02-27 DIAGNOSIS — E559 Vitamin D deficiency, unspecified: Secondary | ICD-10-CM | POA: Diagnosis not present

## 2018-02-27 DIAGNOSIS — M810 Age-related osteoporosis without current pathological fracture: Secondary | ICD-10-CM | POA: Diagnosis not present

## 2018-02-27 DIAGNOSIS — I10 Essential (primary) hypertension: Secondary | ICD-10-CM | POA: Diagnosis not present

## 2018-02-27 DIAGNOSIS — R030 Elevated blood-pressure reading, without diagnosis of hypertension: Secondary | ICD-10-CM | POA: Diagnosis not present

## 2018-03-03 DIAGNOSIS — Z6835 Body mass index (BMI) 35.0-35.9, adult: Secondary | ICD-10-CM | POA: Diagnosis not present

## 2018-03-03 DIAGNOSIS — Z0001 Encounter for general adult medical examination with abnormal findings: Secondary | ICD-10-CM | POA: Diagnosis not present

## 2018-03-03 DIAGNOSIS — I10 Essential (primary) hypertension: Secondary | ICD-10-CM | POA: Diagnosis not present

## 2018-03-03 DIAGNOSIS — Z23 Encounter for immunization: Secondary | ICD-10-CM | POA: Diagnosis not present

## 2018-03-03 DIAGNOSIS — M1712 Unilateral primary osteoarthritis, left knee: Secondary | ICD-10-CM | POA: Diagnosis not present

## 2018-03-03 DIAGNOSIS — H1851 Endothelial corneal dystrophy: Secondary | ICD-10-CM | POA: Diagnosis not present

## 2018-03-03 DIAGNOSIS — K5901 Slow transit constipation: Secondary | ICD-10-CM | POA: Diagnosis not present

## 2018-03-03 DIAGNOSIS — M1711 Unilateral primary osteoarthritis, right knee: Secondary | ICD-10-CM | POA: Diagnosis not present

## 2018-03-03 DIAGNOSIS — M810 Age-related osteoporosis without current pathological fracture: Secondary | ICD-10-CM | POA: Diagnosis not present

## 2018-03-03 DIAGNOSIS — E559 Vitamin D deficiency, unspecified: Secondary | ICD-10-CM | POA: Diagnosis not present

## 2018-03-03 DIAGNOSIS — E782 Mixed hyperlipidemia: Secondary | ICD-10-CM | POA: Diagnosis not present

## 2018-03-05 ENCOUNTER — Encounter: Payer: Self-pay | Admitting: Internal Medicine

## 2018-05-26 ENCOUNTER — Other Ambulatory Visit: Payer: Self-pay | Admitting: *Deleted

## 2018-05-26 ENCOUNTER — Ambulatory Visit (INDEPENDENT_AMBULATORY_CARE_PROVIDER_SITE_OTHER): Payer: PPO | Admitting: Gastroenterology

## 2018-05-26 ENCOUNTER — Encounter: Payer: Self-pay | Admitting: *Deleted

## 2018-05-26 ENCOUNTER — Encounter: Payer: Self-pay | Admitting: Gastroenterology

## 2018-05-26 VITALS — BP 147/81 | HR 99 | Temp 97.6°F | Ht 60.0 in | Wt 184.0 lb

## 2018-05-26 DIAGNOSIS — Z1211 Encounter for screening for malignant neoplasm of colon: Secondary | ICD-10-CM

## 2018-05-26 DIAGNOSIS — K5900 Constipation, unspecified: Secondary | ICD-10-CM | POA: Diagnosis not present

## 2018-05-26 MED ORDER — NA SULFATE-K SULFATE-MG SULF 17.5-3.13-1.6 GM/177ML PO SOLN: 1.0000 | ORAL | 0 refills | Status: DC

## 2018-05-26 NOTE — Progress Notes (Addendum)
REVIEWED-NO ADDITIONAL RECOMMENDATIONS.  Primary Care Physician:  Curlene Labrum, MD Primary Gastroenterologist:  Dr. Oneida Alar   Chief Complaint  Patient presents with  . Abdominal Pain  . Constipation    HPI:   Ariel Walker is a 72 y.o. female presenting today at the request of Dr. Pleas Koch due to abdominal pain and constipation. Outside labs April 2019 with Hgb 14.2, Hct 43.5.   No prior colonoscopy. Predominant constipation. Has RLQ discomfort intermittently. A few times feels like someone is in there with a knife trying to cut their way out. Lasts a few seconds. Some relief if not constipated. Hard bowels. Straining. No rectal bleeding. Has done Metamucil BID with relief of symptoms. Tried to increase dietary fiber as well. Eats chicken, fish, not a lot of meat. No weight loss or lack of appetite. Rare heartburn, not often. Has to be careful with spaghetti sauce, pizza, pepperoni.    Past Medical History:  Diagnosis Date  . High cholesterol   . Hypertension     Past Surgical History:  Procedure Laterality Date  . WISDOM TOOTH EXTRACTION      Current Outpatient Medications  Medication Sig Dispense Refill  . Cholecalciferol (VITAMIN D-1000 MAX ST) 1000 units tablet Take 2 tablets by mouth daily.    Marland Kitchen loratadine (CLARITIN) 10 MG tablet Take 1 tablet by mouth as needed.    . simvastatin (ZOCOR) 40 MG tablet Take 1 tablet by mouth 2 (two) times a week.     No current facility-administered medications for this visit.     Allergies as of 05/26/2018 - Review Complete 05/26/2018  Allergen Reaction Noted  . Aspirin  11/11/2012    Family History  Problem Relation Age of Onset  . Colon cancer Neg Hx   . Colon polyps Neg Hx     Social History   Socioeconomic History  . Marital status: Married    Spouse name: Not on file  . Number of children: Not on file  . Years of education: Not on file  . Highest education level: Not on file  Occupational History  . Not on  file  Social Needs  . Financial resource strain: Not on file  . Food insecurity:    Worry: Not on file    Inability: Not on file  . Transportation needs:    Medical: Not on file    Non-medical: Not on file  Tobacco Use  . Smoking status: Never Smoker  . Smokeless tobacco: Never Used  Substance and Sexual Activity  . Alcohol use: No  . Drug use: No  . Sexual activity: Not on file  Lifestyle  . Physical activity:    Days per week: Not on file    Minutes per session: Not on file  . Stress: Not on file  Relationships  . Social connections:    Talks on phone: Not on file    Gets together: Not on file    Attends religious service: Not on file    Active member of club or organization: Not on file    Attends meetings of clubs or organizations: Not on file    Relationship status: Not on file  . Intimate partner violence:    Fear of current or ex partner: Not on file    Emotionally abused: Not on file    Physically abused: Not on file    Forced sexual activity: Not on file  Other Topics Concern  . Not on file  Social History Narrative  .  Not on file    Review of Systems: Gen: Denies any fever, chills, fatigue, weight loss, lack of appetite.  CV: Denies chest pain, heart palpitations, peripheral edema, syncope.  Resp: Denies shortness of breath at rest or with exertion. Denies wheezing or cough.  GI: see HPI  GU : Denies urinary burning, urinary frequency, urinary hesitancy MS: Denies joint pain, muscle weakness, cramps, or limitation of movement.  Derm: Denies rash, itching, dry skin Psych: Denies depression, anxiety, memory loss, and confusion Heme: Denies bruising, bleeding, and enlarged lymph nodes.  Physical Exam: BP (!) 147/81   Pulse 99   Temp 97.6 F (36.4 C) (Oral)   Ht 5' (1.524 m)   Wt 184 lb (83.5 kg)   BMI 35.94 kg/m  General:   Alert and oriented. Pleasant and cooperative. Well-nourished and well-developed.  Head:  Normocephalic and atraumatic. Eyes:   Without icterus, sclera clear and conjunctiva pink.  Ears:  Normal auditory acuity. Nose:  No deformity, discharge,  or lesions. Mouth:  No deformity or lesions, oral mucosa pink.  Lungs:  Clear to auscultation bilaterally. No wheezes, rales, or rhonchi. No distress.  Heart:  S1, S2 present without murmurs appreciated.  Abdomen:  +BS, soft, non-tender and non-distended. No HSM noted. No guarding or rebound. No masses appreciated.  Rectal:  Deferred  Msk:  Symmetrical without gross deformities. Normal posture. Extremities:  Without  edema. Neurologic:  Alert and  oriented x4 Psych:  Alert and cooperative. Normal mood and affect.

## 2018-05-26 NOTE — Patient Instructions (Signed)
Continue Metamucil twice a day. Please call if this does not improve symptoms as it did before.  We have arranged a colonoscopy with Dr. Oneida Alar in the near future.   It was good to see you again!  It was a pleasure to see you today. I strive to create trusting relationships with patients to provide genuine, compassionate, and quality care. I value your feedback. If you receive a survey regarding your visit,  I greatly appreciate you taking time to fill this out.   Annitta Needs, PhD, ANP-BC [facility]

## 2018-05-29 NOTE — Assessment & Plan Note (Signed)
72 year old female with constipation that has responded well to supplemental fiber; we discussed instituting this again. Vague, fleeting RLQ discomfort noted occasionally but is usually associated with constipation and relieved after BM. No concerning findings on exam or need for imaging at this time. I have asked her to monitor this.   Resume supplemental fiber Increase water intake Proceed with TCS with Dr. Oneida Alar in near future for initial screening colonoscopy: the risks, benefits, and alternatives have been discussed with the patient in detail. The patient states understanding and desires to proceed.  May need to try Linzess or Amitiza if no significant improvement: she is to call.

## 2018-06-01 NOTE — Progress Notes (Signed)
CC'D TO PCP °

## 2018-07-16 ENCOUNTER — Telehealth: Payer: Self-pay

## 2018-07-16 NOTE — Telephone Encounter (Signed)
Spoke with patient. Patient has suprep Rx for prep and clenpiq instructions. Suprep instructions discussed with patient over the phone in detail. She is aware of the times she is suppose to drink prep and how to mix prep. She started clear liquids this morning (all day, NO SOLID FOODS TODAY), procedure is scheduled for tomorrow. SLF, is it still okay to proceed with procedure tomorrow? thanks

## 2018-07-16 NOTE — Telephone Encounter (Signed)
Pt is on the phone and has questions about her prep for her procedure tomorrow.

## 2018-07-17 ENCOUNTER — Encounter (HOSPITAL_COMMUNITY)
Admission: RE | Disposition: A | Discharge status: Home / Self Care | Payer: Self-pay | Source: Ambulatory Visit | Attending: Gastroenterology

## 2018-07-17 ENCOUNTER — Other Ambulatory Visit: Payer: Self-pay

## 2018-07-17 ENCOUNTER — Encounter (HOSPITAL_COMMUNITY): Payer: Self-pay | Admitting: *Deleted

## 2018-07-17 ENCOUNTER — Ambulatory Visit (HOSPITAL_COMMUNITY)
Admission: RE | Admit: 2018-07-17 | Discharge: 2018-07-17 | Disposition: A | Discharge status: Home / Self Care | Payer: PPO | Source: Ambulatory Visit | Attending: Gastroenterology | Admitting: Gastroenterology

## 2018-07-17 DIAGNOSIS — K5730 Diverticulosis of large intestine without perforation or abscess without bleeding: Secondary | ICD-10-CM | POA: Diagnosis not present

## 2018-07-17 DIAGNOSIS — D122 Benign neoplasm of ascending colon: Secondary | ICD-10-CM

## 2018-07-17 DIAGNOSIS — Z886 Allergy status to analgesic agent status: Secondary | ICD-10-CM | POA: Insufficient documentation

## 2018-07-17 DIAGNOSIS — D120 Benign neoplasm of cecum: Secondary | ICD-10-CM

## 2018-07-17 DIAGNOSIS — Z79899 Other long term (current) drug therapy: Secondary | ICD-10-CM | POA: Diagnosis not present

## 2018-07-17 DIAGNOSIS — K644 Residual hemorrhoidal skin tags: Secondary | ICD-10-CM | POA: Diagnosis not present

## 2018-07-17 DIAGNOSIS — K648 Other hemorrhoids: Secondary | ICD-10-CM | POA: Insufficient documentation

## 2018-07-17 DIAGNOSIS — Z1211 Encounter for screening for malignant neoplasm of colon: Secondary | ICD-10-CM

## 2018-07-17 DIAGNOSIS — I10 Essential (primary) hypertension: Secondary | ICD-10-CM | POA: Diagnosis not present

## 2018-07-17 DIAGNOSIS — E7800 Pure hypercholesterolemia, unspecified: Secondary | ICD-10-CM | POA: Diagnosis not present

## 2018-07-17 HISTORY — PX: COLONOSCOPY: SHX5424

## 2018-07-17 HISTORY — DX: Headache, unspecified: R51.9

## 2018-07-17 HISTORY — DX: Headache: R51

## 2018-07-17 HISTORY — PX: POLYPECTOMY: SHX5525

## 2018-07-17 SURGERY — COLONOSCOPY
Anesthesia: Moderate Sedation

## 2018-07-17 MED ORDER — STERILE WATER FOR IRRIGATION IR SOLN
Status: DC | PRN
  Administered 2018-07-17: 09:00:00

## 2018-07-17 MED ORDER — MEPERIDINE HCL 100 MG/ML IJ SOLN
INTRAMUSCULAR | Status: AC
  Filled 2018-07-17: qty 1

## 2018-07-17 MED ORDER — MIDAZOLAM HCL 5 MG/5ML IJ SOLN
INTRAMUSCULAR | Status: AC
  Filled 2018-07-17: qty 5

## 2018-07-17 MED ORDER — MIDAZOLAM HCL 5 MG/5ML IJ SOLN
INTRAMUSCULAR | Status: DC | PRN
  Administered 2018-07-17: 2 mg via INTRAVENOUS
  Administered 2018-07-17 (×2): 1 mg via INTRAVENOUS

## 2018-07-17 MED ORDER — SODIUM CHLORIDE 0.9 % IV SOLN
INTRAVENOUS | Status: DC
  Administered 2018-07-17: 08:00:00 via INTRAVENOUS

## 2018-07-17 MED ORDER — MEPERIDINE HCL 100 MG/ML IJ SOLN
INTRAMUSCULAR | Status: DC | PRN
  Administered 2018-07-17: 25 mg

## 2018-07-17 NOTE — Discharge Instructions (Signed)
You have small internal hemorrhoids and diverticulosis IN YOUR LEFT COLON. YOU HAD TWO POLYPS REMOVED. ONE WAS GREATER THAN ONE CM.   DRINK WATER TO KEEP YOUR URINE LIGHT YELLOW.  CONTINUE YOUR WEIGHT LOSS EFFORTS. YOUR BODY MASS INDEX IS OVER 30 WHICH MEANS YOU ARE OBESE. OBESITY IS ASSOCIATED WITH AN INCREASE FOR ALL CANCERS, INCLUDING ESOPHAGEAL AND COLON CANCER. A WEIGHT OF 150 LBS OR LESS  WILL GET YOUR BODY MASS INDEX(BMI) UNDER 30.  FOLLOW A HIGH FIBER DIET. AVOID ITEMS THAT CAUSE BLOATING. See info below.  YOUR BIOPSY RESULTS WILL BE AVAILABLE IN 7 DAYS.   USE PREPARATION H FOUR TIMES  A DAY IF NEEDED TO RELIEVE RECTAL PAIN/PRESSURE/BLEEDING.  Next colonoscopy in 3-5 years.  Colonoscopy Care After Read the instructions outlined below and refer to this sheet in the next week. These discharge instructions provide you with general information on caring for yourself after you leave the hospital. While your treatment has been planned according to the most current medical practices available, unavoidable complications occasionally occur. If you have any problems or questions after discharge, call DR. FIELDS, [phone].  ACTIVITY  You may resume your regular activity, but move at a slower pace for the next 24 hours.   Take frequent rest periods for the next 24 hours.   Walking will help get rid of the air and reduce the bloated feeling in your belly (abdomen).   No driving for 24 hours (because of the medicine (anesthesia) used during the test).   You may shower.   Do not sign any important legal documents or operate any machinery for 24 hours (because of the anesthesia used during the test).    NUTRITION  Drink plenty of fluids.   You may resume your normal diet as instructed by your doctor.   Begin with a light meal and progress to your normal diet. Heavy or fried foods are harder to digest and may make you feel sick to your stomach (nauseated).   Avoid alcoholic  beverages for 24 hours or as instructed.    MEDICATIONS  You may resume your normal medications.   WHAT YOU CAN EXPECT TODAY  Some feelings of bloating in the abdomen.   Passage of more gas than usual.   Spotting of blood in your stool or on the toilet paper  .  IF YOU HAD POLYPS REMOVED DURING THE COLONOSCOPY:  Eat a soft diet IF YOU HAVE NAUSEA, BLOATING, ABDOMINAL PAIN, OR VOMITING.    FINDING OUT THE RESULTS OF YOUR TEST Not all test results are available during your visit. DR. Oneida Alar WILL CALL YOU WITHIN 14 DAYS OF YOUR PROCEDUE WITH YOUR RESULTS. Do not assume everything is normal if you have not heard from DR. FIELDS, CALL HER OFFICE AT [phone].  SEEK IMMEDIATE MEDICAL ATTENTION AND CALL THE OFFICE: [phone] IF:  You have more than a spotting of blood in your stool.   Your belly is swollen (abdominal distention).   You are nauseated or vomiting.   You have a temperature over 101F.   You have abdominal pain or discomfort that is severe or gets worse throughout the day.  High-Fiber Diet A high-fiber diet changes your normal diet to include more whole grains, legumes, fruits, and vegetables. Changes in the diet involve replacing refined carbohydrates with unrefined foods. The calorie level of the diet is essentially unchanged. The Dietary Reference Intake (recommended amount) for adult males is 38 grams per day. For adult females, it is 25 grams per  day. Pregnant and lactating women should consume 28 grams of fiber per day. Fiber is the intact part of a plant that is not broken down during digestion. Functional fiber is fiber that has been isolated from the plant to provide a beneficial effect in the body. PURPOSE  Increase stool bulk.   Ease and regulate bowel movements.   Lower cholesterol.   REDUCE RISK OF COLON CANCER  INDICATIONS THAT YOU NEED MORE FIBER  Constipation and hemorrhoids.   Uncomplicated diverticulosis (intestine condition) and  irritable bowel syndrome.   Weight management.   As a protective measure against hardening of the arteries (atherosclerosis), diabetes, and cancer.   GUIDELINES FOR INCREASING FIBER IN THE DIET  Start adding fiber to the diet slowly. A gradual increase of about 5 more grams (2 slices of whole-wheat bread, 2 servings of most fruits or vegetables, or 1 bowl of high-fiber cereal) per day is best. Too rapid an increase in fiber may result in constipation, flatulence, and bloating.   Drink enough water and fluids to keep your urine clear or pale yellow. Water, juice, or caffeine-free drinks are recommended. Not drinking enough fluid may cause constipation.   Eat a variety of high-fiber foods rather than one type of fiber.   Try to increase your intake of fiber through using high-fiber foods rather than fiber pills or supplements that contain small amounts of fiber.   The goal is to change the types of food eaten. Do not supplement your present diet with high-fiber foods, but replace foods in your present diet.   INCLUDE A VARIETY OF FIBER SOURCES  Replace refined and processed grains with whole grains, canned fruits with fresh fruits, and incorporate other fiber sources. White rice, white breads, and most bakery goods contain little or no fiber.   Brown whole-grain rice, buckwheat oats, and many fruits and vegetables are all good sources of fiber. These include: broccoli, Brussels sprouts, cabbage, cauliflower, beets, sweet potatoes, white potatoes (skin on), carrots, tomatoes, eggplant, squash, berries, fresh fruits, and dried fruits.   Cereals appear to be the richest source of fiber. Cereal fiber is found in whole grains and bran. Bran is the fiber-rich outer coat of cereal grain, which is largely removed in refining. In whole-grain cereals, the bran remains. In breakfast cereals, the largest amount of fiber is found in those with "bran" in their names. The fiber content is sometimes indicated  on the label.   You may need to include additional fruits and vegetables each day.   In baking, for 1 cup white flour, you may use the following substitutions:   1 cup whole-wheat flour minus 2 tablespoons.   1/2 cup white flour plus 1/2 cup whole-wheat flour.   Polyps, Colon  A polyp is extra tissue that grows inside your body. Colon polyps grow in the large intestine. The large intestine, also called the colon, is part of your digestive system. It is a long, hollow tube at the end of your digestive tract where your body makes and stores stool. Most polyps are not dangerous. They are benign. This means they are not cancerous. But over time, some types of polyps can turn into cancer. Polyps that are smaller than a pea are usually not harmful. But larger polyps could someday become or may already be cancerous. To be safe, doctors remove all polyps and test them.   PREVENTION There is not one sure way to prevent polyps. You might be able to lower your risk of getting them  if you:  Eat more fruits and vegetables and less fatty food.   Do not smoke.   Avoid alcohol.   Exercise every day.   Lose weight if you are overweight.   Eating more calcium and folate can also lower your risk of getting polyps. Some foods that are rich in calcium are milk, cheese, and broccoli. Some foods that are rich in folate are chickpeas, kidney beans, and spinach.    Diverticulosis Diverticulosis is a common condition that develops when small pouches (diverticula) form in the wall of the colon. The risk of diverticulosis increases with age. It happens more often in people who eat a low-fiber diet. Most individuals with diverticulosis have no symptoms. Those individuals with symptoms usually experience belly (abdominal) pain, constipation, or loose stools (diarrhea).  HOME CARE INSTRUCTIONS  Increase the amount of fiber in your diet as directed by your caregiver or dietician. This may reduce symptoms of  diverticulosis.   Drink at least 6 to 8 glasses of water each day to prevent constipation.   Try not to strain when you have a bowel movement.   Avoiding nuts and seeds to prevent complications is NOT NECESSARY.   FOODS HAVING HIGH FIBER CONTENT INCLUDE:  Fruits. Apple, peach, pear, tangerine, raisins, prunes.   Vegetables. Brussels sprouts, asparagus, broccoli, cabbage, carrot, cauliflower, romaine lettuce, spinach, summer squash, tomato, winter squash, zucchini.   Starchy Vegetables. Baked beans, kidney beans, lima beans, split peas, lentils, potatoes (with skin).   Grains. Whole wheat bread, brown rice, bran flake cereal, plain oatmeal, white rice, shredded wheat, bran muffins.   SEEK IMMEDIATE MEDICAL CARE IF:  You develop increasing pain or severe bloating.   You have an oral temperature above 101F.   You develop vomiting or bowel movements that are bloody or black.   Hemorrhoids Hemorrhoids are dilated (enlarged) veins around the rectum. Sometimes clots will form in the veins. This makes them swollen and painful. These are called thrombosed hemorrhoids. Causes of hemorrhoids include:  Constipation.   Straining to have a bowel movement.   HEAVY LIFTING   HOME CARE INSTRUCTIONS  Eat a well balanced diet and drink 6 to 8 glasses of water every day to avoid constipation. You may also use a bulk laxative.   Avoid straining to have bowel movements.   Keep anal area dry and clean.   Do not use a donut shaped pillow or sit on the toilet for long periods. This increases blood pooling and pain.   Move your bowels when your body has the urge; this will require less straining and will decrease pain and pressure.

## 2018-07-17 NOTE — H&P (Signed)
Primary Care Physician:  Curlene Labrum, MD Primary Gastroenterologist:  Dr. Oneida Alar  Pre-Procedure History & Physical: HPI:  Ariel Walker is a 72 y.o. female here for [place].  Past Medical History:  Diagnosis Date  . Headache   . High cholesterol   . Hypertension     Past Surgical History:  Procedure Laterality Date  . WISDOM TOOTH EXTRACTION      Prior to Admission medications   Medication Sig Start Date End Date Taking? Authorizing Provider  Cholecalciferol (VITAMIN D-1000 MAX ST) 1000 units tablet Take 2,000 Units by mouth daily.    Yes [provider]  Na Sulfate-K Sulfate-Mg Sulf 17.5-3.13-1.6 GM/177ML SOLN Take 1 kit by mouth as directed. 05/26/18  Yes Jo Booze L, MD  simvastatin (ZOCOR) 40 MG tablet Take 1 tablet by mouth 2 (two) times a week.   Yes [provider]    Allergies as of 05/26/2018 - Review Complete 05/26/2018  Allergen Reaction Noted  . Aspirin  11/11/2012    Family History  Problem Relation Age of Onset  . Colon cancer Neg Hx   . Colon polyps Neg Hx     Social History   Socioeconomic History  . Marital status: Married    Spouse name: Not on file  . Number of children: Not on file  . Years of education: Not on file  . Highest education level: Not on file  Occupational History  . Not on file  Social Needs  . Financial resource strain: Not on file  . Food insecurity:    Worry: Not on file    Inability: Not on file  . Transportation needs:    Medical: Not on file    Non-medical: Not on file  Tobacco Use  . Smoking status: Never Smoker  . Smokeless tobacco: Never Used  Substance and Sexual Activity  . Alcohol use: No  . Drug use: No  . Sexual activity: Not on file  Lifestyle  . Physical activity:    Days per week: Not on file    Minutes per session: Not on file  . Stress: Not on file  Relationships  . Social connections:    Talks on phone: Not on file    Gets together: Not on file   Attends religious service: Not on file    Active member of club or organization: Not on file    Attends meetings of clubs or organizations: Not on file    Relationship status: Not on file  . Intimate partner violence:    Fear of current or ex partner: Not on file    Emotionally abused: Not on file    Physically abused: Not on file    Forced sexual activity: Not on file  Other Topics Concern  . Not on file  Social History Narrative  . Not on file    Review of Systems: See HPI, otherwise negative ROS   Physical Exam: BP (!) 180/92   Pulse 94   Temp 98.7 F (37.1 C) (Oral)   Resp 12   Ht 5' (1.524 m)   Wt 83.5 kg   SpO2 94%   BMI 35.94 kg/m  General:   Alert,  pleasant and cooperative in NAD Head:  Normocephalic and atraumatic. Neck:  Supple; Lungs:  Clear throughout to auscultation.    Heart:  Regular rate and rhythm. Abdomen:  Soft, nontender and nondistended. Normal bowel sounds, without guarding, and without rebound.   Neurologic:  Alert and  oriented  x4;  grossly normal neurologically.  Impression/Plan:    SCREENING  Plan:  1. TCS TODAY DISCUSSED PROCEDURE, BENEFITS, & RISKS: < 1% chance of medication reaction, bleeding, perforation, or rupture of spleen/liver.

## 2018-07-17 NOTE — Telephone Encounter (Signed)
Noted  

## 2018-07-17 NOTE — Telephone Encounter (Signed)
[place] for pt to have TCS.

## 2018-07-17 NOTE — Op Note (Signed)
[facility] Patient Name: Ariel Walker Procedure Date: 07/17/2018 8:31 AM MRN: [id number] Date of Birth: [birth year] Attending MD: Barney Drain MD, MD CSN: [id number] Age: 72 Admit Type: Outpatient Procedure:                Colonoscopy WITH SNARE CAUTERY POLYPECTOMY Indications:              Screening for colorectal malignant neoplasm Providers:                Barney Drain MD, MD, Janeece Riggers, RN, Nelma Rothman,                            Technician Referring MD:             Curlene Labrum Medicines:                Meperidine 25 mg IV, Midazolam 4 mg IV Complications:            No immediate complications. Estimated Blood Loss:     Estimated blood loss was minimal. Procedure:                Pre-Anesthesia Assessment:                           - Prior to the procedure, a History and Physical                            was performed, and patient medications and                            allergies were reviewed. The patient's tolerance of                            previous anesthesia was also reviewed. The risks                            and benefits of the procedure and the sedation                            options and risks were discussed with the patient.                            All questions were answered, and informed consent                            was obtained. Prior Anticoagulants: The patient has                            taken no previous anticoagulant or antiplatelet                            agents. ASA Grade Assessment: I - A normal, healthy                            patient. After reviewing the risks and benefits,  the patient was deemed in satisfactory condition to                            undergo the procedure. After obtaining informed                            consent, the colonoscope was passed under direct                            vision. Throughout the procedure, the patient's                            blood pressure,  pulse, and oxygen saturations were                            monitored continuously. The CF-[id number] ([id number])                            scope was introduced through the anus and advanced                            to the the cecum, identified by appendiceal orifice                            and ileocecal valve. The colonoscopy was somewhat                            difficult due to a tortuous colon. Successful                            completion of the procedure was aided by increasing                            the dose of sedation medication, straightening and                            shortening the scope to obtain bowel loop reduction                            and COLOWRAP. The patient tolerated the procedure                            well. The quality of the bowel preparation was                            excellent. The ileocecal valve, appendiceal                            orifice, and rectum were photographed. Scope In: 8:55:23 AM Scope Out: 9:14:21 AM Scope Withdrawal Time: 0 hours 15 minutes 41 seconds  Total Procedure Duration: 0 hours 18 minutes 58 seconds  Findings:      ONE pedunculated CECAL and ONE sessile ASCENDING COLON POLYP were found.  The polyps were 6 to 15 mm in size. These polyps were removed with a hot       snare. Resection and retrieval were complete. Estimated blood loss was       minimal.      Multiple small and large-mouthed diverticula were found in the       recto-sigmoid colon and sigmoid colon.      External and internal hemorrhoids were found. The hemorrhoids were       moderate. Impression:               - Two 6 to 15 mm polyps in the proximal ascending                            colon and in the cecum, removed with a hot snare.                            Resected and retrieved.                           - MODERATE Diverticulosis in the recto-sigmoid                            colon and in the sigmoid colon.                           -  External and internal hemorrhoids. Moderate Sedation:      Moderate (conscious) sedation was administered by the endoscopy nurse       and supervised by the endoscopist. The following parameters were       monitored: oxygen saturation, heart rate, blood pressure, and response       to care. Total physician intraservice time was 33 minutes. Recommendation:           - Patient has a contact number available for                            emergencies. The signs and symptoms of potential                            delayed complications were discussed with the                            patient. Return to normal activities tomorrow.                            Written discharge instructions were provided to the                            patient.                           - High fiber diet.                           - Continue present medications.                           -  Await pathology results.                           - Repeat colonoscopy in 5-10 years for surveillance. Procedure Code(s):        --- Professional ---                           [phone], Colonoscopy, flexible; with removal of                            tumor(s), polyp(s), or other lesion(s) by snare                            technique                           G0500, Moderate sedation services provided by the                            same physician or other qualified health care                            professional performing a gastrointestinal                            endoscopic service that sedation supports,                            requiring the presence of an independent trained                            observer to assist in the monitoring of the                            patient's level of consciousness and physiological                            status; initial 15 minutes of intra-service time;                            patient age 55 years or older (additional time may                            be  reported with [phone], as appropriate)                           [phone], Moderate sedation services provided by the                            same physician or other qualified health care                            professional performing the diagnostic or  therapeutic service that the sedation supports,                            requiring the presence of an independent trained                            observer to assist in the monitoring of the                            patient's level of consciousness and physiological                            status; each additional 15 minutes intraservice                            time (List separately in addition to code for                            primary service) Diagnosis Code(s):        --- Professional ---                           Z12.11, Encounter for screening for malignant                            neoplasm of colon                           D12.2, Benign neoplasm of ascending colon                           D12.0, Benign neoplasm of cecum                           K64.8, Other hemorrhoids                           K57.30, Diverticulosis of large intestine without                            perforation or abscess without bleeding CPT copyright 2017 [employer]. All rights reserved. The codes documented in this report are preliminary and upon coder review may  be revised to meet current compliance requirements. Barney Drain, MD Barney Drain MD, MD 07/17/2018 9:29:34 AM This report has been signed electronically. Number of Addenda: 0

## 2018-07-21 NOTE — Progress Notes (Signed)
Pt is aware.  

## 2018-07-22 ENCOUNTER — Encounter (HOSPITAL_COMMUNITY): Payer: Self-pay | Admitting: Gastroenterology

## 2018-10-20 DIAGNOSIS — I10 Essential (primary) hypertension: Secondary | ICD-10-CM | POA: Diagnosis not present

## 2018-10-20 DIAGNOSIS — E782 Mixed hyperlipidemia: Secondary | ICD-10-CM | POA: Diagnosis not present

## 2018-10-23 DIAGNOSIS — I10 Essential (primary) hypertension: Secondary | ICD-10-CM | POA: Diagnosis not present

## 2018-10-23 DIAGNOSIS — E782 Mixed hyperlipidemia: Secondary | ICD-10-CM | POA: Diagnosis not present

## 2018-10-23 DIAGNOSIS — Z1331 Encounter for screening for depression: Secondary | ICD-10-CM | POA: Diagnosis not present

## 2018-10-23 DIAGNOSIS — Z1389 Encounter for screening for other disorder: Secondary | ICD-10-CM | POA: Diagnosis not present

## 2018-10-23 DIAGNOSIS — Z6836 Body mass index (BMI) 36.0-36.9, adult: Secondary | ICD-10-CM | POA: Diagnosis not present

## 2018-10-23 DIAGNOSIS — D126 Benign neoplasm of colon, unspecified: Secondary | ICD-10-CM | POA: Diagnosis not present

## 2018-10-23 DIAGNOSIS — M810 Age-related osteoporosis without current pathological fracture: Secondary | ICD-10-CM | POA: Diagnosis not present

## 2019-02-01 ENCOUNTER — Other Ambulatory Visit (HOSPITAL_COMMUNITY): Payer: Self-pay | Admitting: Family Medicine

## 2019-02-01 DIAGNOSIS — Z1231 Encounter for screening mammogram for malignant neoplasm of breast: Secondary | ICD-10-CM

## 2019-02-08 ENCOUNTER — Ambulatory Visit (HOSPITAL_COMMUNITY)
Admission: RE | Admit: 2019-02-08 | Discharge: 2019-02-08 | Disposition: A | Discharge status: Home / Self Care | Payer: PPO | Source: Ambulatory Visit | Attending: Family Medicine | Admitting: Family Medicine

## 2019-02-08 ENCOUNTER — Other Ambulatory Visit: Payer: Self-pay

## 2019-02-08 DIAGNOSIS — Z1231 Encounter for screening mammogram for malignant neoplasm of breast: Secondary | ICD-10-CM | POA: Diagnosis not present

## 2019-06-25 ENCOUNTER — Other Ambulatory Visit: Payer: Self-pay

## 2019-09-03 ENCOUNTER — Ambulatory Visit
Admission: EM | Admit: 2019-09-03 | Discharge: 2019-09-03 | Disposition: A | Discharge status: Home / Self Care | Payer: PPO | Attending: Emergency Medicine | Admitting: Emergency Medicine

## 2019-09-03 ENCOUNTER — Other Ambulatory Visit: Payer: Self-pay

## 2019-09-03 DIAGNOSIS — R350 Frequency of micturition: Secondary | ICD-10-CM | POA: Diagnosis not present

## 2019-09-03 DIAGNOSIS — N3001 Acute cystitis with hematuria: Secondary | ICD-10-CM | POA: Diagnosis not present

## 2019-09-03 LAB — POCT URINALYSIS DIP (MANUAL ENTRY)
Bilirubin, UA: NEGATIVE
Glucose, UA: NEGATIVE mg/dL
Ketones, POC UA: NEGATIVE mg/dL
Leukocytes, UA: NEGATIVE
Nitrite, UA: NEGATIVE
Protein Ur, POC: NEGATIVE mg/dL
Spec Grav, UA: <=1.005 — AB (ref 1.010–1.025)
Urobilinogen, UA: 0.2 E.U./dL
pH, UA: 5 (ref 5.0–8.0)

## 2019-09-03 MED ORDER — PHENAZOPYRIDINE HCL 200 MG PO TABS: 200.0000 mg | ORAL_TABLET | Freq: Three times a day (TID) | ORAL | 0 refills | Status: AC

## 2019-09-03 MED ORDER — CEPHALEXIN 500 MG PO CAPS: 500.0000 mg | ORAL_CAPSULE | Freq: Two times a day (BID) | ORAL | 0 refills | Status: AC

## 2019-09-03 NOTE — Discharge Instructions (Signed)
Urine showed trace blood which may be a sign of infection.  Given your symptoms as well we will treat for UTI Urine culture sent.  We will call you with abnormal results.   Push fluids and get plenty of rest.   Take antibiotic as directed and to completion Take pyridium as prescribed and as needed for symptomatic relief Follow up with PCP if symptoms persists Return here or go to ER if you have any new or worsening symptoms such as fever, worsening abdominal pain, nausea/vomiting, flank pain, etc..Marland Kitchen

## 2019-09-03 NOTE — ED Provider Notes (Signed)
[facility]   CC: UTI  SUBJECTIVE:  Ariel Walker is a 73 y.o. female who complains of urinary frequency, urgency, low back discomfort, and lower abdominal pressure for the past 2 days.  Patient denies a precipitating event, recent sexual encounter, excessive caffeine intake.   Has NOT tried OTC medications.  Symptoms are made worse with urination.  Admits to similar symptoms in the past related to UTI.  Denies fever, chills, nausea, vomiting, abdominal pain, flank pain, abnormal vaginal discharge or bleeding, hematuria.    LMP: No LMP recorded. Patient is postmenopausal.  Has not had a hysterectomy   ROS: As in HPI.  All other pertinent ROS negative.     Past Medical History:  Diagnosis Date  . Headache   . High cholesterol   . Hypertension    Past Surgical History:  Procedure Laterality Date  . COLONOSCOPY N/A 07/17/2018   Procedure: COLONOSCOPY;  Surgeon: Danie Binder, MD;  [facility]: [facility];  Service: Endoscopy;  Laterality: N/A;  8:30am  . POLYPECTOMY  07/17/2018   Procedure: POLYPECTOMY;  Surgeon: Danie Binder, MD;  Location: [facility];  Service: Endoscopy;;  cecal polyp hs, ascending colon polyp hs  . WISDOM TOOTH EXTRACTION     Allergies  Allergen Reactions  . Aspirin Other (See Comments)    Causes cold sores in mouth  . Azithromycin Diarrhea   No current facility-administered medications on file prior to encounter.    Current Outpatient Medications on File Prior to Encounter  Medication Sig Dispense Refill  . Cholecalciferol (VITAMIN D-1000 MAX ST) 1000 units tablet Take 2,000 Units by mouth daily.     . [DISCONTINUED] simvastatin (ZOCOR) 40 MG tablet Take 1 tablet by mouth 2 (two) times a week.     Social History   Socioeconomic History  . Marital status: Married    Spouse name: Not on file  . Number of children: Not on file  . Years of education: Not on file  . Highest education level: Not on file  Occupational History  . Not on  file  Social Needs  . Financial resource strain: Not on file  . Food insecurity    Worry: Not on file    Inability: Not on file  . Transportation needs    Medical: Not on file    Non-medical: Not on file  Tobacco Use  . Smoking status: Never Smoker  . Smokeless tobacco: Never Used  Substance and Sexual Activity  . Alcohol use: No  . Drug use: No  . Sexual activity: Not on file  Lifestyle  . Physical activity    Days per week: Not on file    Minutes per session: Not on file  . Stress: Not on file  Relationships  . Social Herbalist on phone: Not on file    Gets together: Not on file    Attends religious service: Not on file    Active member of club or organization: Not on file    Attends meetings of clubs or organizations: Not on file    Relationship status: Not on file  . Intimate partner violence    Fear of current or ex partner: Not on file    Emotionally abused: Not on file    Physically abused: Not on file    Forced sexual activity: Not on file  Other Topics Concern  . Not on file  Social History Narrative  . Not on file   Family  History  Problem Relation Age of Onset  . Colon cancer Neg Hx   . Colon polyps Neg Hx     OBJECTIVE:  Vitals:   09/03/19 1014  BP: (!) 146/89  Pulse: 91  Resp: 20  Temp: 98.6 F (37 C)  SpO2: 93%   General appearance: Alert in no acute distress HEENT: NCAT.  Oropharynx clear.  Lungs: clear to auscultation bilaterally without adventitious breath sounds Heart: regular rate and rhythm.  Radial pulses 2+ symmetrical bilaterally Abdomen: soft; non-distended; no tenderness; bowel sounds present; no guarding Back: no CVA tenderness Extremities: no edema; symmetrical with no gross deformities Skin: warm and dry Neurologic: Ambulates from chair to exam table without difficulty Psychological: alert and cooperative; normal mood and affect  Labs Reviewed  POCT URINALYSIS DIP (MANUAL ENTRY) - Abnormal; Notable for the  following components:      Result Value   Spec Grav, UA <=1.005 (*)    Blood, UA trace-intact (*)    All other components within normal limits   Results for orders placed or performed during the hospital encounter of 09/03/19 (from the past 24 hour(s))  POCT urinalysis dipstick     Status: Abnormal   Collection Time: 09/03/19 10:26 AM  Result Value Ref Range   Color, UA yellow yellow   Clarity, UA clear clear   Glucose, UA negative negative mg/dL   Bilirubin, UA negative negative   Ketones, POC UA negative negative mg/dL   Spec Grav, UA <=1.005 (A) 1.010 - 1.025   Blood, UA trace-intact (A) negative   pH, UA 5.0 5.0 - 8.0   Protein Ur, POC negative negative mg/dL   Urobilinogen, UA 0.2 0.2 or 1.0 E.U./dL   Nitrite, UA Negative Negative   Leukocytes, UA Negative Negative     ASSESSMENT & PLAN:  1. Urinary frequency   2. Acute cystitis with hematuria     Meds ordered this encounter  Medications  . cephALEXin (KEFLEX) 500 MG capsule    Sig: Take 1 capsule (500 mg total) by mouth 2 (two) times daily for 7 days.    Dispense:  14 capsule    Refill:  0    Order Specific Question:   Supervising Provider    Answer:   Raylene Everts [id number]  . phenazopyridine (PYRIDIUM) 200 MG tablet    Sig: Take 1 tablet (200 mg total) by mouth 3 (three) times daily.    Dispense:  6 tablet    Refill:  0    Order Specific Question:   Supervising Provider    Answer:   Raylene Everts [id number]   Urine showed trace blood which may be a sign of infection.  Given your symptoms as well we will treat for UTI Urine culture sent.  We will call you with abnormal results.   Push fluids and get plenty of rest.   Take antibiotic as directed and to completion Take pyridium as prescribed and as needed for symptomatic relief Follow up with PCP if symptoms persists Return here or go to ER if you have any new or worsening symptoms such as fever, worsening abdominal pain, nausea/vomiting, flank pain,  etc...  Outlined signs and symptoms indicating need for more acute intervention. Patient verbalized understanding. After Visit Summary given.     Lestine Box, PA-C 09/03/19 1037

## 2019-09-03 NOTE — ED Triage Notes (Signed)
Pt has frequent urination with lower back and lower abdominal pain

## 2019-09-04 LAB — URINE CULTURE: Culture: NO GROWTH

## 2019-11-24 DIAGNOSIS — Z6837 Body mass index (BMI) 37.0-37.9, adult: Secondary | ICD-10-CM | POA: Diagnosis not present

## 2019-11-24 DIAGNOSIS — I10 Essential (primary) hypertension: Secondary | ICD-10-CM | POA: Diagnosis not present

## 2019-11-24 DIAGNOSIS — R300 Dysuria: Secondary | ICD-10-CM | POA: Diagnosis not present

## 2019-12-20 DIAGNOSIS — R739 Hyperglycemia, unspecified: Secondary | ICD-10-CM | POA: Diagnosis not present

## 2019-12-20 DIAGNOSIS — E559 Vitamin D deficiency, unspecified: Secondary | ICD-10-CM | POA: Diagnosis not present

## 2019-12-20 DIAGNOSIS — E782 Mixed hyperlipidemia: Secondary | ICD-10-CM | POA: Diagnosis not present

## 2019-12-20 DIAGNOSIS — I10 Essential (primary) hypertension: Secondary | ICD-10-CM | POA: Diagnosis not present

## 2019-12-27 DIAGNOSIS — D126 Benign neoplasm of colon, unspecified: Secondary | ICD-10-CM | POA: Diagnosis not present

## 2019-12-27 DIAGNOSIS — Z23 Encounter for immunization: Secondary | ICD-10-CM | POA: Diagnosis not present

## 2019-12-27 DIAGNOSIS — Z0001 Encounter for general adult medical examination with abnormal findings: Secondary | ICD-10-CM | POA: Diagnosis not present

## 2019-12-27 DIAGNOSIS — M810 Age-related osteoporosis without current pathological fracture: Secondary | ICD-10-CM | POA: Diagnosis not present

## 2019-12-27 DIAGNOSIS — R0789 Other chest pain: Secondary | ICD-10-CM | POA: Diagnosis not present

## 2019-12-27 DIAGNOSIS — Z6837 Body mass index (BMI) 37.0-37.9, adult: Secondary | ICD-10-CM | POA: Diagnosis not present

## 2019-12-27 DIAGNOSIS — I10 Essential (primary) hypertension: Secondary | ICD-10-CM | POA: Diagnosis not present

## 2019-12-27 DIAGNOSIS — R0600 Dyspnea, unspecified: Secondary | ICD-10-CM | POA: Diagnosis not present

## 2019-12-28 ENCOUNTER — Other Ambulatory Visit (HOSPITAL_COMMUNITY): Payer: Self-pay | Admitting: Family Medicine

## 2019-12-28 DIAGNOSIS — Z780 Asymptomatic menopausal state: Secondary | ICD-10-CM

## 2019-12-29 ENCOUNTER — Other Ambulatory Visit (HOSPITAL_COMMUNITY): Payer: Self-pay | Admitting: Family Medicine

## 2019-12-29 DIAGNOSIS — Z1231 Encounter for screening mammogram for malignant neoplasm of breast: Secondary | ICD-10-CM

## 2020-01-04 DIAGNOSIS — R0602 Shortness of breath: Secondary | ICD-10-CM | POA: Diagnosis not present

## 2020-01-04 DIAGNOSIS — Z8249 Family history of ischemic heart disease and other diseases of the circulatory system: Secondary | ICD-10-CM | POA: Diagnosis not present

## 2020-01-04 DIAGNOSIS — R0789 Other chest pain: Secondary | ICD-10-CM | POA: Diagnosis not present

## 2020-01-04 DIAGNOSIS — R0600 Dyspnea, unspecified: Secondary | ICD-10-CM | POA: Diagnosis not present

## 2020-01-04 DIAGNOSIS — R079 Chest pain, unspecified: Secondary | ICD-10-CM | POA: Diagnosis not present

## 2020-01-06 ENCOUNTER — Other Ambulatory Visit (HOSPITAL_COMMUNITY): Payer: PPO

## 2020-02-09 ENCOUNTER — Ambulatory Visit (HOSPITAL_COMMUNITY)
Admission: RE | Admit: 2020-02-09 | Discharge: 2020-02-09 | Disposition: A | Discharge status: Home / Self Care | Payer: PPO | Source: Ambulatory Visit | Attending: Family Medicine | Admitting: Family Medicine

## 2020-02-09 ENCOUNTER — Other Ambulatory Visit: Payer: Self-pay

## 2020-02-09 DIAGNOSIS — Z1231 Encounter for screening mammogram for malignant neoplasm of breast: Secondary | ICD-10-CM | POA: Diagnosis not present

## 2020-02-09 DIAGNOSIS — M818 Other osteoporosis without current pathological fracture: Secondary | ICD-10-CM | POA: Diagnosis not present

## 2020-02-09 DIAGNOSIS — M810 Age-related osteoporosis without current pathological fracture: Secondary | ICD-10-CM | POA: Diagnosis not present

## 2020-02-09 DIAGNOSIS — Z780 Asymptomatic menopausal state: Secondary | ICD-10-CM

## 2020-02-17 ENCOUNTER — Ambulatory Visit: Payer: PPO | Attending: Internal Medicine

## 2020-02-17 DIAGNOSIS — Z23 Encounter for immunization: Secondary | ICD-10-CM

## 2020-02-17 NOTE — Progress Notes (Signed)
   Covid-19 Vaccination Clinic  Name:  Ariel Walker    MRN: [id number] DOB: 1946-03-26  02/17/2020  Ms. Ariel Walker was observed post Covid-19 immunization for 15 minutes without incident. She was provided with Vaccine Information Sheet and instruction to access the V-Safe system.   Ms. Ariel Walker was instructed to call 911 with any severe reactions post vaccine: Marland Kitchen Difficulty breathing  . Swelling of face and throat  . A fast heartbeat  . A bad rash all over body  . Dizziness and weakness   Immunizations Administered    Name Date Dose VIS Date Route   Moderna COVID-19 Vaccine 02/17/2020  9:56 AM 0.5 mL 10/26/2019 Intramuscular   Manufacturer: Moderna   Lot: [id number]   [id number]

## 2020-03-06 DIAGNOSIS — L720 Epidermal cyst: Secondary | ICD-10-CM | POA: Diagnosis not present

## 2020-03-21 ENCOUNTER — Ambulatory Visit: Payer: PPO | Attending: Internal Medicine

## 2020-03-21 DIAGNOSIS — Z23 Encounter for immunization: Secondary | ICD-10-CM

## 2020-03-21 NOTE — Progress Notes (Signed)
   Covid-19 Vaccination Clinic  Name:  Ariel Walker    MRN: [id number] DOB: 07-Jun-1946  03/21/2020  [name] was observed post Covid-19 immunization for 15 minutes without incident. She was provided with Vaccine Information Sheet and instruction to access the V-Safe system.   [name] was instructed to call 911 with any severe reactions post vaccine: Marland Kitchen Difficulty breathing  . Swelling of face and throat  . A fast heartbeat  . A bad rash all over body  . Dizziness and weakness   Immunizations Administered    Name Date Dose VIS Date Route   Moderna COVID-19 Vaccine 03/21/2020  9:27 AM 0.5 mL 10/2019 Intramuscular   Manufacturer: Moderna   Lot: [id number]   [id number]

## 2020-04-18 DIAGNOSIS — E782 Mixed hyperlipidemia: Secondary | ICD-10-CM | POA: Diagnosis not present

## 2020-04-18 DIAGNOSIS — I10 Essential (primary) hypertension: Secondary | ICD-10-CM | POA: Diagnosis not present

## 2020-04-18 DIAGNOSIS — R739 Hyperglycemia, unspecified: Secondary | ICD-10-CM | POA: Diagnosis not present

## 2020-04-20 DIAGNOSIS — Z1331 Encounter for screening for depression: Secondary | ICD-10-CM | POA: Diagnosis not present

## 2020-04-20 DIAGNOSIS — E782 Mixed hyperlipidemia: Secondary | ICD-10-CM | POA: Diagnosis not present

## 2020-04-20 DIAGNOSIS — Z6837 Body mass index (BMI) 37.0-37.9, adult: Secondary | ICD-10-CM | POA: Diagnosis not present

## 2020-04-20 DIAGNOSIS — Z1389 Encounter for screening for other disorder: Secondary | ICD-10-CM | POA: Diagnosis not present

## 2020-04-20 DIAGNOSIS — R0789 Other chest pain: Secondary | ICD-10-CM | POA: Diagnosis not present

## 2020-04-20 DIAGNOSIS — D126 Benign neoplasm of colon, unspecified: Secondary | ICD-10-CM | POA: Diagnosis not present

## 2020-04-20 DIAGNOSIS — I10 Essential (primary) hypertension: Secondary | ICD-10-CM | POA: Diagnosis not present

## 2020-04-20 DIAGNOSIS — I5032 Chronic diastolic (congestive) heart failure: Secondary | ICD-10-CM | POA: Diagnosis not present

## 2020-07-27 ENCOUNTER — Ambulatory Visit
Admission: EM | Admit: 2020-07-27 | Discharge: 2020-07-27 | Disposition: A | Discharge status: Home / Self Care | Payer: PPO | Attending: Emergency Medicine | Admitting: Emergency Medicine

## 2020-07-27 ENCOUNTER — Other Ambulatory Visit: Payer: Self-pay

## 2020-07-27 ENCOUNTER — Encounter: Payer: Self-pay | Admitting: Emergency Medicine

## 2020-07-27 ENCOUNTER — Ambulatory Visit (INDEPENDENT_AMBULATORY_CARE_PROVIDER_SITE_OTHER): Payer: PPO

## 2020-07-27 DIAGNOSIS — W19XXXA Unspecified fall, initial encounter: Secondary | ICD-10-CM | POA: Diagnosis not present

## 2020-07-27 DIAGNOSIS — M79641 Pain in right hand: Secondary | ICD-10-CM

## 2020-07-27 NOTE — ED Triage Notes (Signed)
Pt tripped and fell, tried to catch herself with her RT hand.  Having pain to hand and fingers.

## 2020-07-27 NOTE — ED Provider Notes (Signed)
[facility]   [id number] 07/27/20 Arrival Time: 1132   Chief Complaint  Patient presents with  . Hand Pain     SUBJECTIVE: History from: patient.  Ariel Walker is a 74 y.o. female who presented to the urgent care for complaint of right hand pain that started after tripping and fell yesterday.  Denies any precipitating event.  She localizes the pain to the right hand and fingers.  She describes the pain as constant and achy.  She e has tried OTC medications without relief.  Her symptoms are made worse with ROM.  She denies similar symptoms in the past.  Denies chills, fever, nausea, vomiting, diarrhea, LOC, blurry vision, diplopia.  ROS: As per HPI.  All other pertinent ROS negative.      Past Medical History:  Diagnosis Date  . Headache   . High cholesterol   . Hypertension    Past Surgical History:  Procedure Laterality Date  . COLONOSCOPY N/A 07/17/2018   Procedure: COLONOSCOPY;  Surgeon: Danie Binder, MD;  [facility];  Service: Endoscopy;  Laterality: N/A;  8:30am  . POLYPECTOMY  07/17/2018   Procedure: POLYPECTOMY;  Surgeon: Danie Binder, MD;  [facility];  Service: Endoscopy;;  cecal polyp hs, ascending colon polyp hs  . WISDOM TOOTH EXTRACTION     Allergies  Allergen Reactions  . Aspirin Other (See Comments)    Causes cold sores in mouth  . Azithromycin Diarrhea   No current facility-administered medications on file prior to encounter.   Current Outpatient Medications on File Prior to Encounter  Medication Sig Dispense Refill  . Cholecalciferol (VITAMIN D-1000 MAX ST) 1000 units tablet Take 2,000 Units by mouth daily.     . phenazopyridine (PYRIDIUM) 200 MG tablet Take 1 tablet (200 mg total) by mouth 3 (three) times daily. 6 tablet 0  . [DISCONTINUED] simvastatin (ZOCOR) 40 MG tablet Take 1 tablet by mouth 2 (two) times a week.     Social History   Socioeconomic History  . Marital status: Married    Spouse name: Not on  file  . Number of children: Not on file  . Years of education: Not on file  . Highest education level: Not on file  Occupational History  . Not on file  Tobacco Use  . Smoking status: Never Smoker  . Smokeless tobacco: Never Used  Vaping Use  . Vaping Use: Never used  Substance and Sexual Activity  . Alcohol use: No  . Drug use: No  . Sexual activity: Not on file  Other Topics Concern  . Not on file  Social History Narrative  . Not on file   Social Determinants of Health   Financial Resource Strain:   . Difficulty of Paying Living Expenses: Not on file  Food Insecurity:   . Worried About Charity fundraiser in the Last Year: Not on file  . Ran Out of Food in the Last Year: Not on file  Transportation Needs:   . Lack of Transportation (Medical): Not on file  . Lack of Transportation (Non-Medical): Not on file  Physical Activity:   . Days of Exercise per Week: Not on file  . Minutes of Exercise per Session: Not on file  Stress:   . Feeling of Stress : Not on file  Social Connections:   . Frequency of Communication with Friends and Family: Not on file  . Frequency of Social Gatherings with Friends and Family: Not on file  .  Attends Religious Services: Not on file  . Active Member of Clubs or Organizations: Not on file  . Attends Archivist Meetings: Not on file  . Marital Status: Not on file  Intimate Partner Violence:   . Fear of Current or Ex-Partner: Not on file  . Emotionally Abused: Not on file  . Physically Abused: Not on file  . Sexually Abused: Not on file   Family History  Problem Relation Age of Onset  . Colon cancer Neg Hx   . Colon polyps Neg Hx     OBJECTIVE:  Vitals:   07/27/20 1149 07/27/20 1151  BP:  134/82  Pulse:  82  Resp:  18  Temp:  98.3 F (36.8 C)  TempSrc:  Oral  SpO2:  99%  Weight: 180 lb (81.6 kg)   Height: 5' (1.524 m)      Physical Exam Vitals and nursing note reviewed.  Constitutional:      General: She is not  in acute distress.    Appearance: Normal appearance. She is normal weight. She is not ill-appearing, toxic-appearing or diaphoretic.  HENT:     Head: Normocephalic.  Cardiovascular:     Rate and Rhythm: Normal rate and regular rhythm.     Pulses: Normal pulses.     Heart sounds: Normal heart sounds. No murmur heard.  No friction rub. No gallop.   Pulmonary:     Effort: Pulmonary effort is normal. No respiratory distress.     Breath sounds: Normal breath sounds. No stridor. No wheezing, rhonchi or rales.  Chest:     Chest wall: No tenderness.  Musculoskeletal:        General: Tenderness present.     Right hand: Tenderness present.     Left hand: Normal.     Comments: The right hand is without any obvious asymmetry or deformity when compared to the left hand.  No surface trauma, ecchymosis, open wound, subungual hematoma, lesion.  Normal cascade of finger.  Limited range of motion due to pain.  Neurovascular status intact.  Neurological:     Mental Status: She is alert and oriented to person, place, and time.     LABS:  No results found for this or any previous visit (from the past 24 hour(s)).   RADIOLOGY  DG Hand Complete Right  Result Date: 07/27/2020 CLINICAL DATA:  Right hand pain after fall today. EXAM: RIGHT HAND - COMPLETE 3+ VIEW COMPARISON:  None. FINDINGS: There is no evidence of fracture or dislocation. There is no evidence of arthropathy or other focal bone abnormality. Soft tissues are unremarkable. IMPRESSION: Negative. Electronically Signed   By: Marijo Conception M.D.   On: 07/27/2020 12:06     ASSESSMENT & PLAN:  1. Right hand pain   2. Fall, initial encounter     No orders of the defined types were placed in this encounter.   Discharge Instructions Take OTC Tylenol/ibuprofen as needed for pain Follow RICE instruction that is attached Follow-up with PCP Return or go to ED for worsening of symptoms  Reviewed expectations re: course of current medical  issues. Questions answered. Outlined signs and symptoms indicating need for more acute intervention. Patient verbalized understanding. After Visit Summary given.      Note: This document was prepared using Dragon voice recognition software and may include unintentional dictation errors.    Emerson Monte, FNP 07/27/20 1235

## 2020-07-27 NOTE — Discharge Instructions (Addendum)
Take OTC Tylenol/ibuprofen as needed for pain Follow RICE instruction that is attached Follow-up with PCP Return or go to ED for worsening of symptoms 

## 2020-10-07 IMAGING — DX DG HAND COMPLETE 3+V*R*
3 series · 3 of 3 positions shown · non-contrast
Comparison: None.

CLINICAL DATA: Right hand pain after fall today.

EXAM:
RIGHT HAND - COMPLETE 3+ VIEW

[hand pa]
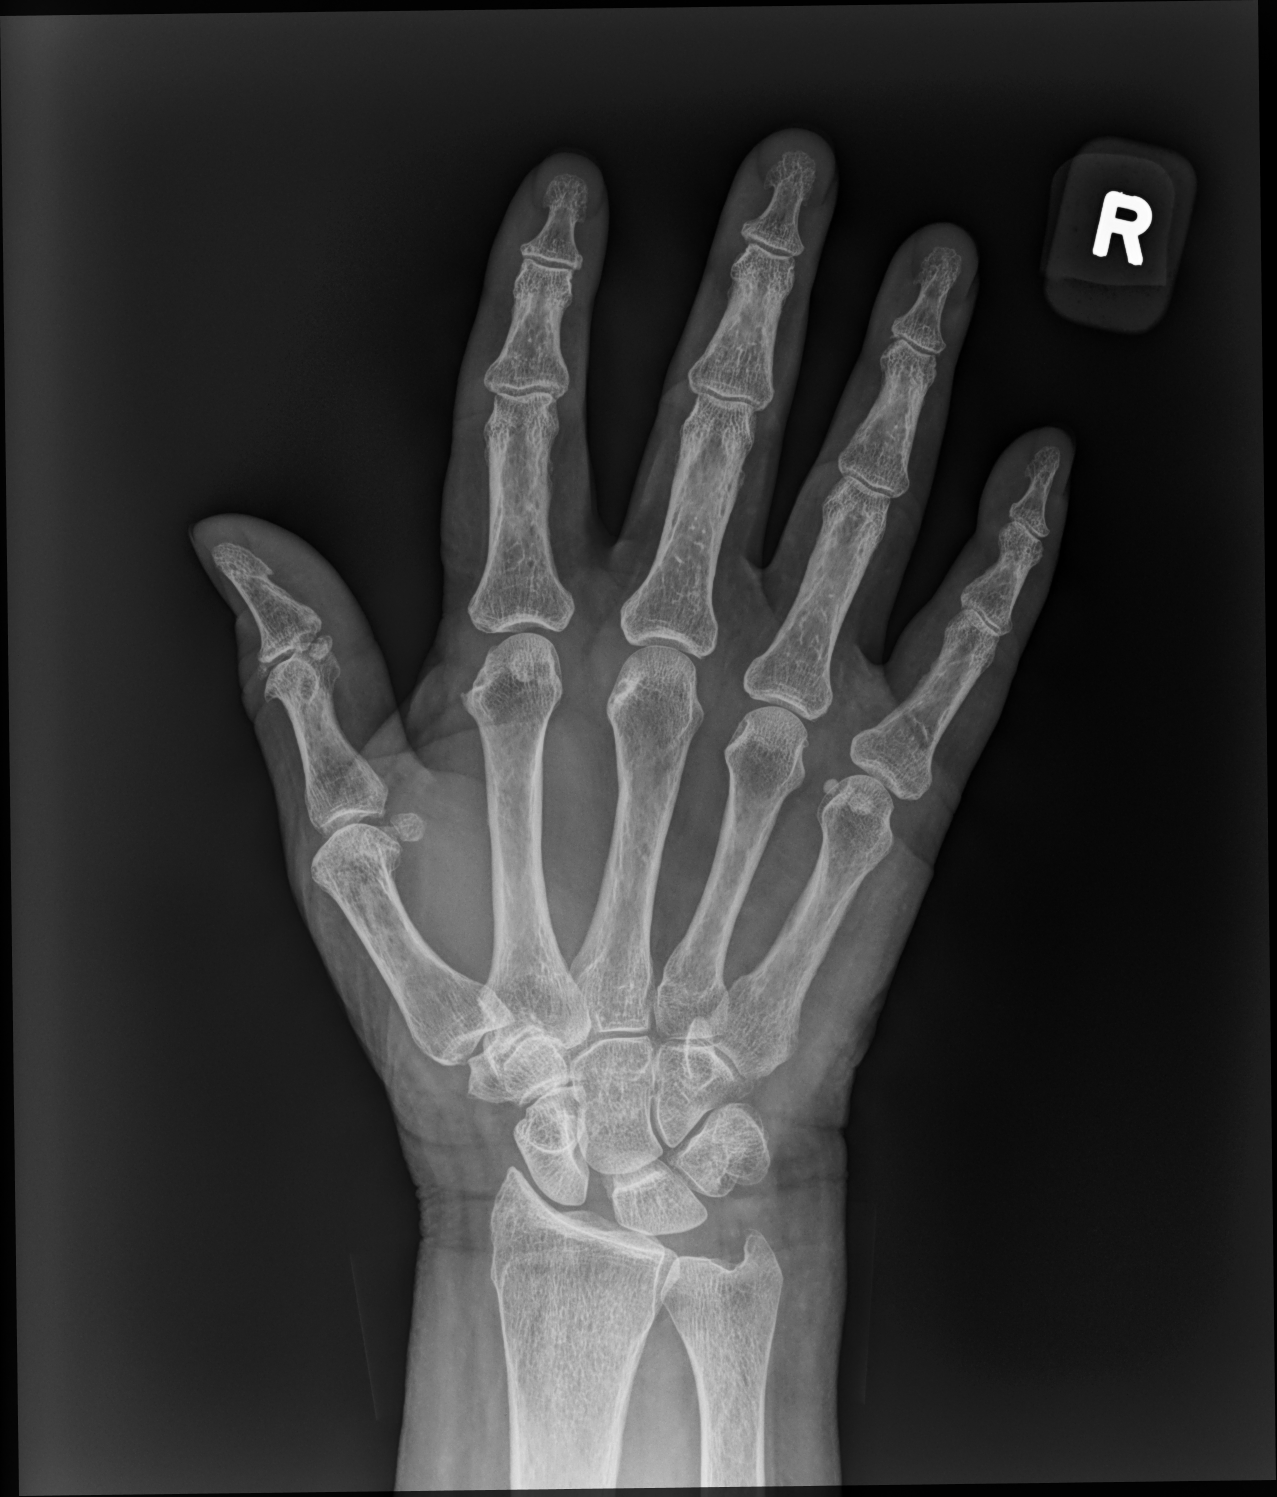

[hand mlo]
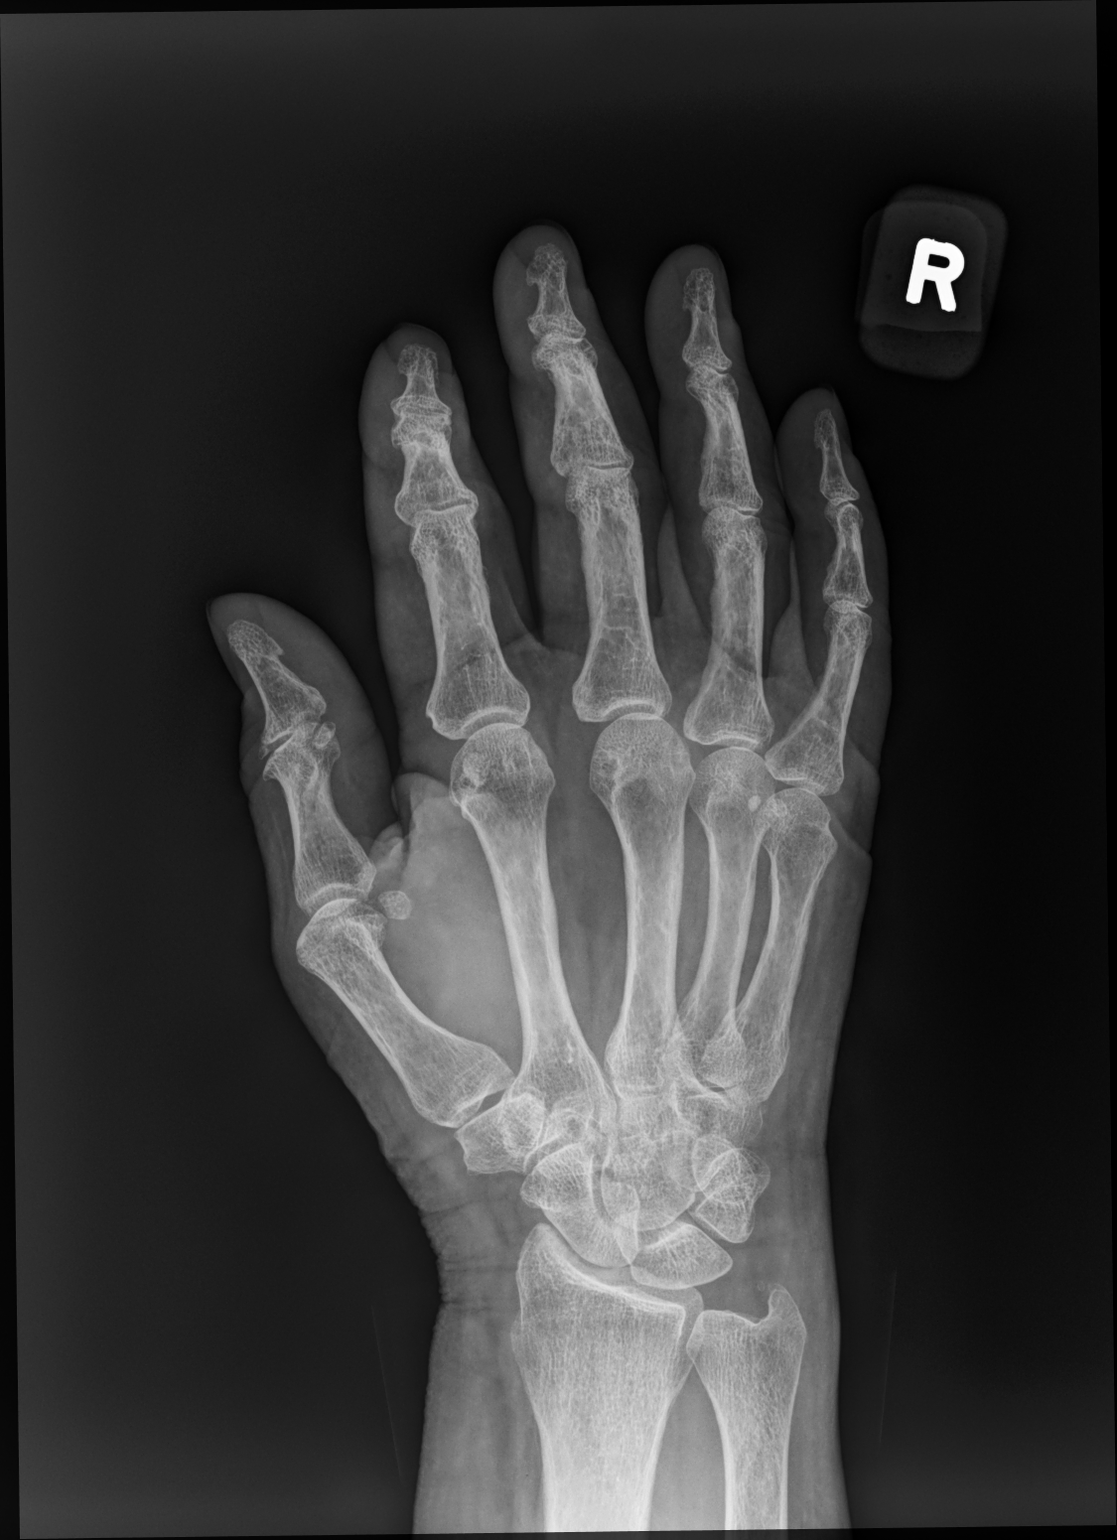

[hand lat]
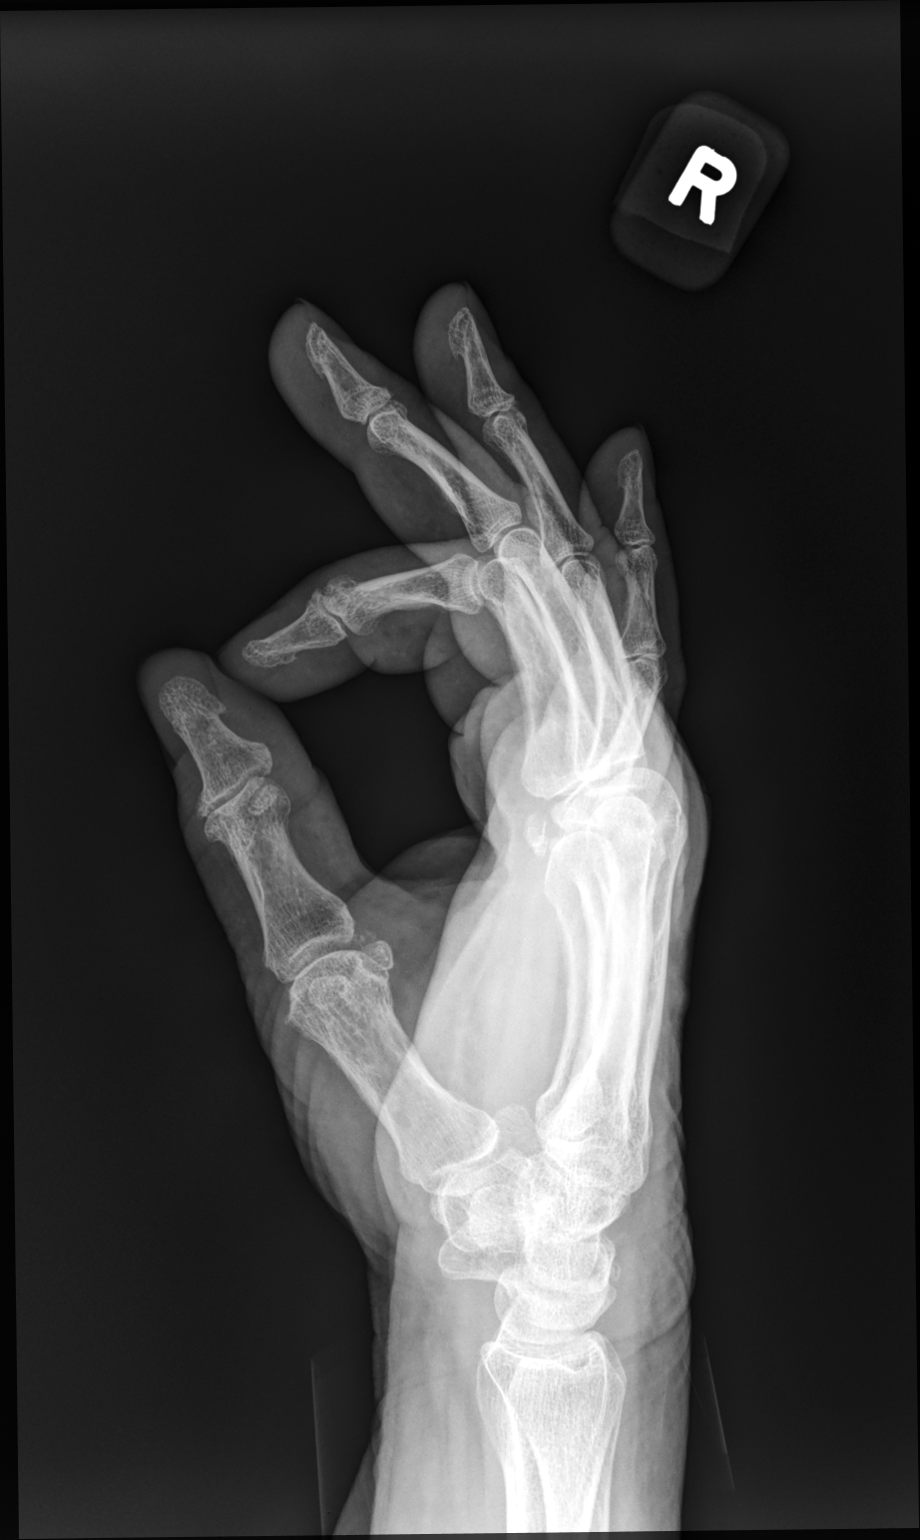

[3 of 3 positions shown; findings below may reference images not displayed]

FINDINGS: There is no evidence of fracture or dislocation. There is no
evidence of arthropathy or other focal bone abnormality. Soft
tissues are unremarkable.
IMPRESSION: Negative.

## 2020-12-08 DIAGNOSIS — E782 Mixed hyperlipidemia: Secondary | ICD-10-CM | POA: Diagnosis not present

## 2020-12-08 DIAGNOSIS — I10 Essential (primary) hypertension: Secondary | ICD-10-CM | POA: Diagnosis not present

## 2020-12-08 DIAGNOSIS — R739 Hyperglycemia, unspecified: Secondary | ICD-10-CM | POA: Diagnosis not present

## 2020-12-08 DIAGNOSIS — Z1329 Encounter for screening for other suspected endocrine disorder: Secondary | ICD-10-CM | POA: Diagnosis not present

## 2020-12-26 ENCOUNTER — Other Ambulatory Visit (HOSPITAL_COMMUNITY): Payer: Self-pay | Admitting: Family Medicine

## 2020-12-26 DIAGNOSIS — I5032 Chronic diastolic (congestive) heart failure: Secondary | ICD-10-CM | POA: Diagnosis not present

## 2020-12-26 DIAGNOSIS — E7849 Other hyperlipidemia: Secondary | ICD-10-CM | POA: Diagnosis not present

## 2020-12-26 DIAGNOSIS — M810 Age-related osteoporosis without current pathological fracture: Secondary | ICD-10-CM | POA: Diagnosis not present

## 2020-12-26 DIAGNOSIS — R944 Abnormal results of kidney function studies: Secondary | ICD-10-CM | POA: Diagnosis not present

## 2020-12-26 DIAGNOSIS — Z6837 Body mass index (BMI) 37.0-37.9, adult: Secondary | ICD-10-CM | POA: Diagnosis not present

## 2020-12-26 DIAGNOSIS — Z1231 Encounter for screening mammogram for malignant neoplasm of breast: Secondary | ICD-10-CM

## 2020-12-26 DIAGNOSIS — I10 Essential (primary) hypertension: Secondary | ICD-10-CM | POA: Diagnosis not present

## 2021-02-12 ENCOUNTER — Ambulatory Visit (HOSPITAL_COMMUNITY): Payer: PPO

## 2021-06-14 ENCOUNTER — Encounter: Payer: Self-pay | Admitting: *Deleted

## 2021-06-21 DIAGNOSIS — E559 Vitamin D deficiency, unspecified: Secondary | ICD-10-CM | POA: Diagnosis not present

## 2021-06-21 DIAGNOSIS — E7849 Other hyperlipidemia: Secondary | ICD-10-CM | POA: Diagnosis not present

## 2021-06-21 DIAGNOSIS — R739 Hyperglycemia, unspecified: Secondary | ICD-10-CM | POA: Diagnosis not present

## 2021-06-21 DIAGNOSIS — E782 Mixed hyperlipidemia: Secondary | ICD-10-CM | POA: Diagnosis not present

## 2021-06-21 DIAGNOSIS — I10 Essential (primary) hypertension: Secondary | ICD-10-CM | POA: Diagnosis not present

## 2021-06-25 DIAGNOSIS — D126 Benign neoplasm of colon, unspecified: Secondary | ICD-10-CM | POA: Diagnosis not present

## 2021-06-25 DIAGNOSIS — H6121 Impacted cerumen, right ear: Secondary | ICD-10-CM | POA: Diagnosis not present

## 2021-06-25 DIAGNOSIS — Z0001 Encounter for general adult medical examination with abnormal findings: Secondary | ICD-10-CM | POA: Diagnosis not present

## 2021-06-25 DIAGNOSIS — M810 Age-related osteoporosis without current pathological fracture: Secondary | ICD-10-CM | POA: Diagnosis not present

## 2021-06-25 DIAGNOSIS — H18519 Endothelial corneal dystrophy, unspecified eye: Secondary | ICD-10-CM | POA: Diagnosis not present

## 2021-06-25 DIAGNOSIS — I5032 Chronic diastolic (congestive) heart failure: Secondary | ICD-10-CM | POA: Diagnosis not present

## 2021-06-25 DIAGNOSIS — Z6836 Body mass index (BMI) 36.0-36.9, adult: Secondary | ICD-10-CM | POA: Diagnosis not present

## 2021-06-25 DIAGNOSIS — E7849 Other hyperlipidemia: Secondary | ICD-10-CM | POA: Diagnosis not present

## 2021-06-28 ENCOUNTER — Other Ambulatory Visit: Payer: Self-pay

## 2021-06-28 ENCOUNTER — Ambulatory Visit (HOSPITAL_COMMUNITY)
Admission: RE | Admit: 2021-06-28 | Discharge: 2021-06-28 | Disposition: A | Discharge status: Home / Self Care | Payer: PPO | Source: Ambulatory Visit | Attending: Family Medicine | Admitting: Family Medicine

## 2021-06-28 DIAGNOSIS — Z1231 Encounter for screening mammogram for malignant neoplasm of breast: Secondary | ICD-10-CM | POA: Diagnosis not present

## 2021-11-07 DIAGNOSIS — E7849 Other hyperlipidemia: Secondary | ICD-10-CM | POA: Diagnosis not present

## 2021-11-07 DIAGNOSIS — H9190 Unspecified hearing loss, unspecified ear: Secondary | ICD-10-CM | POA: Diagnosis not present

## 2021-11-07 DIAGNOSIS — I5032 Chronic diastolic (congestive) heart failure: Secondary | ICD-10-CM | POA: Diagnosis not present

## 2021-11-07 DIAGNOSIS — R42 Dizziness and giddiness: Secondary | ICD-10-CM | POA: Diagnosis not present

## 2021-11-07 DIAGNOSIS — Z6836 Body mass index (BMI) 36.0-36.9, adult: Secondary | ICD-10-CM | POA: Diagnosis not present

## 2021-11-19 ENCOUNTER — Ambulatory Visit
Admission: EM | Admit: 2021-11-19 | Discharge: 2021-11-19 | Disposition: A | Discharge status: Home / Self Care | Payer: PPO

## 2021-11-19 ENCOUNTER — Other Ambulatory Visit: Payer: Self-pay

## 2021-11-19 DIAGNOSIS — J069 Acute upper respiratory infection, unspecified: Secondary | ICD-10-CM

## 2021-11-19 MED ORDER — PROMETHAZINE-DM 6.25-15 MG/5ML PO SYRP: 5.0000 mL | ORAL_SOLUTION | Freq: Four times a day (QID) | ORAL | 0 refills | Status: AC | PRN

## 2021-11-19 MED ORDER — AMOXICILLIN-POT CLAVULANATE 875-125 MG PO TABS: 1.0000 | ORAL_TABLET | Freq: Two times a day (BID) | ORAL | 0 refills | Status: AC

## 2021-11-19 NOTE — ED Triage Notes (Signed)
Patient states she has been sick for a week.  Patient states she has a horrible cough and it wont go away. She states that when she coughs her chest hurts.  She states both of her ears are aching.  She states she has tried all OTC without any relief.   Denies Fever  Denies Meds this morning   Patient states that she does not want to be tested for Covid/Flu

## 2021-11-19 NOTE — ED Provider Notes (Signed)
[facility]    CSN: [id number] Arrival date & time: 11/19/21  3536      History   Chief Complaint Chief Complaint  Patient presents with   Sore Throat    Cough and sore throat     HPI Ariel Walker is a 75 y.o. female.   Presenting today with over a week of worsening hacking productive cough, congestion, now chest tightness and some shortness of breath.  Denies known fever, chills, body aches, chest pain, abdominal pain, nausea vomiting or diarrhea.  Has taken numerous over-the-counter medications with minimal relief.  Denies any known pulmonary disease.  No known sick contacts recently.   Past Medical History:  Diagnosis Date   Headache    High cholesterol    Hypertension     Patient Active Problem List   Diagnosis Date Noted   Constipation 05/26/2018   Encounter for screening colonoscopy 05/26/2018    Past Surgical History:  Procedure Laterality Date   COLONOSCOPY N/A 07/17/2018   Procedure: COLONOSCOPY;  Surgeon: Danie Binder, MD;  [facility];  Service: Endoscopy;  Laterality: N/A;  8:30am   POLYPECTOMY  07/17/2018   Procedure: POLYPECTOMY;  Surgeon: Danie Binder, MD;  [facility];  Service: Endoscopy;;  cecal polyp hs, ascending colon polyp hs   WISDOM TOOTH EXTRACTION      OB History   No obstetric history on file.      Home Medications    Prior to Admission medications   Medication Sig Start Date End Date Taking? Authorizing Provider  amoxicillin-clavulanate (AUGMENTIN) 875-125 MG tablet Take 1 tablet by mouth every 12 (twelve) hours. 11/19/21  Yes Volney American, PA-C  loratadine (CLARITIN) 10 MG tablet Take by mouth. 11/21/14  Yes [provider]  promethazine-dextromethorphan (PROMETHAZINE-DM) 6.25-15 MG/5ML syrup Take 5 mLs by mouth 4 (four) times daily as needed. 11/19/21  Yes Volney American, PA-C  Cholecalciferol (VITAMIN D-1000 MAX ST) 1000 units tablet Take 2,000 Units by mouth  daily.     [provider]  phenazopyridine (PYRIDIUM) 200 MG tablet Take 1 tablet (200 mg total) by mouth 3 (three) times daily. 09/03/19   W[place]st, [place], PA-C  pravastatin (PRAVACHOL) 20 MG tablet Take 20 mg by mouth 3 (three) times a week. 06/25/21   [provider]  simvastatin (ZOCOR) 40 MG tablet Take 1 tablet by mouth 2 (two) times a week.  09/03/19  [provider]    Family History Family History  Problem Relation Age of Onset   Colon cancer Neg Hx    Colon polyps Neg Hx     Social History Social History   Tobacco Use   Smoking status: Never   Smokeless tobacco: Never  Vaping Use   Vaping Use: Never used  Substance Use Topics   Alcohol use: No   Drug use: No     Allergies   Aspirin and Azithromycin   Review of Systems Review of Systems Per HPI  Physical Exam Triage Vital Signs ED Triage Vitals  Enc Vitals Group     BP 11/19/21 1152 (!) 156/91     Pulse Rate 11/19/21 1152 87     Resp 11/19/21 1152 16     Temp 11/19/21 1152 98.4 F (36.9 C)     Temp Source 11/19/21 1152 Oral     SpO2 11/19/21 1152 94 %     Weight --      Height --      Head  Circumference --      Peak Flow --      Pain Score 11/19/21 1150 6     Pain Loc --      Pain Edu? --      Excl. in [place]? --    No data found.  Updated Vital Signs BP (!) 156/91 (BP Location: Right Arm)    Pulse 87    Temp 98.4 F (36.9 C) (Oral)    Resp 16    SpO2 94%   Visual Acuity Right Eye Distance:   Left Eye Distance:   Bilateral Distance:    Right Eye Near:   Left Eye Near:    Bilateral Near:     Physical Exam Vitals and nursing note reviewed.  Constitutional:      Appearance: Normal appearance. She is not ill-appearing.  HENT:     Head: Atraumatic.     Right Ear: Tympanic membrane and external ear normal.     Left Ear: Tympanic membrane and external ear normal.     Nose: Rhinorrhea present.     Mouth/Throat:     Mouth: Mucous membranes are moist.     Pharynx:  Posterior oropharyngeal erythema present.  Eyes:     Extraocular Movements: Extraocular movements intact.     Conjunctiva/sclera: Conjunctivae normal.  Cardiovascular:     Rate and Rhythm: Normal rate and regular rhythm.     Heart sounds: Normal heart sounds.  Pulmonary:     Effort: Pulmonary effort is normal.     Breath sounds: Normal breath sounds. No wheezing or rales.  Musculoskeletal:        General: Normal range of motion.     Cervical back: Normal range of motion and neck supple.  Skin:    General: Skin is warm and dry.  Neurological:     Mental Status: She is alert and oriented to person, place, and time.  Psychiatric:        Mood and Affect: Mood normal.        Thought Content: Thought content normal.        Judgment: Judgment normal.     UC Treatments / Results  Labs (all labs ordered are listed, but only abnormal results are displayed) Labs Reviewed - No data to display  EKG   Radiology No results found.  Procedures Procedures (including critical care time)  Medications Ordered in UC Medications - No data to display  Initial Impression / Assessment and Plan / UC Course  I have reviewed the triage vital signs and the nursing notes.  Pertinent labs & imaging results that were available during my care of the patient were reviewed by me and considered in my medical decision making (see chart for details).     Given duration and worsening course, will treat with Augmentin, Phenergan DM, supportive over-the-counter medications and home care.  Return for acutely worsening symptoms.  Final Clinical Impressions(s) / UC Diagnoses   Final diagnoses:  Upper respiratory tract infection, unspecified type   Discharge Instructions   None    ED Prescriptions     Medication Sig Dispense Auth. Provider   amoxicillin-clavulanate (AUGMENTIN) 875-125 MG tablet Take 1 tablet by mouth every 12 (twelve) hours. 14 tablet Volney American, [place]    promethazine-dextromethorphan (PROMETHAZINE-DM) 6.25-15 MG/5ML syrup Take 5 mLs by mouth 4 (four) times daily as needed. 100 mL Volney American, [place]      PDMP not reviewed this encounter.   Volney American, [place] 11/19/21 1300

## 2023-06-04 ENCOUNTER — Encounter: Payer: Self-pay | Admitting: *Deleted
